# Patient Record
Sex: Female | Born: 1950 | Race: White | Hispanic: No | Marital: Married | Smoking: Never smoker
Health system: Southern US, Community
[De-identification: ages and names within clinical notes are randomized; demographics above are authoritative.]

## PROBLEM LIST (undated history)

## (undated) DIAGNOSIS — M1990 Unspecified osteoarthritis, unspecified site: Secondary | ICD-10-CM

## (undated) DIAGNOSIS — E878 Other disorders of electrolyte and fluid balance, not elsewhere classified: Secondary | ICD-10-CM

## (undated) DIAGNOSIS — Z9889 Other specified postprocedural states: Secondary | ICD-10-CM

## (undated) DIAGNOSIS — I10 Essential (primary) hypertension: Secondary | ICD-10-CM

## (undated) DIAGNOSIS — K219 Gastro-esophageal reflux disease without esophagitis: Secondary | ICD-10-CM

## (undated) DIAGNOSIS — D649 Anemia, unspecified: Secondary | ICD-10-CM

## (undated) DIAGNOSIS — R112 Nausea with vomiting, unspecified: Secondary | ICD-10-CM

## (undated) HISTORY — PX: CATARACT EXTRACTION: SUR2

## (undated) HISTORY — PX: CHOLECYSTECTOMY: SHX55

---

## 1977-02-10 DIAGNOSIS — R011 Cardiac murmur, unspecified: Secondary | ICD-10-CM

## 1977-02-10 HISTORY — DX: Cardiac murmur, unspecified: R01.1

## 1977-02-23 DIAGNOSIS — R011 Cardiac murmur, unspecified: Secondary | ICD-10-CM | POA: Insufficient documentation

## 2002-02-10 HISTORY — PX: CHOLECYSTECTOMY: SHX55

## 2002-02-10 HISTORY — PX: BREAST BIOPSY: SHX20

## 2002-07-25 DIAGNOSIS — E7800 Pure hypercholesterolemia, unspecified: Secondary | ICD-10-CM | POA: Insufficient documentation

## 2010-12-30 ENCOUNTER — Ambulatory Visit: Payer: Self-pay

## 2011-10-21 ENCOUNTER — Ambulatory Visit: Payer: Self-pay | Admitting: Family Medicine

## 2013-03-15 DIAGNOSIS — I10 Essential (primary) hypertension: Secondary | ICD-10-CM | POA: Insufficient documentation

## 2013-03-23 ENCOUNTER — Ambulatory Visit: Payer: Self-pay | Admitting: Internal Medicine

## 2014-10-19 ENCOUNTER — Other Ambulatory Visit: Payer: Self-pay | Admitting: Family

## 2014-10-19 DIAGNOSIS — Z1231 Encounter for screening mammogram for malignant neoplasm of breast: Secondary | ICD-10-CM

## 2014-10-25 ENCOUNTER — Ambulatory Visit: Payer: Self-pay

## 2015-04-08 ENCOUNTER — Ambulatory Visit
Admission: EM | Admit: 2015-04-08 | Discharge: 2015-04-08 | Disposition: A | Discharge status: Home / Self Care | Payer: Medicare PPO | Attending: Family Medicine | Admitting: Family Medicine

## 2015-04-08 DIAGNOSIS — B349 Viral infection, unspecified: Secondary | ICD-10-CM

## 2015-04-08 HISTORY — DX: Essential (primary) hypertension: I10

## 2015-04-08 HISTORY — DX: Other disorders of electrolyte and fluid balance, not elsewhere classified: E87.8

## 2015-04-08 LAB — RAPID INFLUENZA A&B ANTIGENS (ARMC ONLY)
INFLUENZA A (ARMC): NOT DETECTED
INFLUENZA B (ARMC): NOT DETECTED

## 2015-04-08 MED ORDER — HYDROCOD POLST-CPM POLST ER 10-8 MG/5ML PO SUER: 5.0000 mL | Freq: Two times a day (BID) | ORAL | Status: DC | PRN

## 2015-04-08 MED ORDER — OSELTAMIVIR PHOSPHATE 75 MG PO CAPS: 75.0000 mg | ORAL_CAPSULE | Freq: Two times a day (BID) | ORAL | Status: DC

## 2015-04-08 NOTE — ED Notes (Signed)
Patient states that her husband has the flu.  She is experiencing cough, fever, body aches, and slight nasal congestion which all started last night.

## 2015-04-08 NOTE — ED Provider Notes (Signed)
CSN: [id number]     Arrival date & time 04/08/15  1404 History   None    Chief Complaint  Patient presents with  . Cough  . Generalized Body Aches   (Consider location/radiation/quality/duration/timing/severity/associated sxs/prior Treatment) Patient is a 65 y.o. female presenting with URI. The history is provided by the patient.  URI Presenting symptoms: congestion, cough, fatigue, fever and rhinorrhea   Severity:  Moderate Onset quality:  Sudden Timing:  Constant Progression:  Worsening Chronicity:  New Relieved by:  None tried Ineffective treatments:  None tried Associated symptoms: arthralgias and myalgias   Associated symptoms: no neck pain, no sinus pain, no sneezing, no swollen glands and no wheezing   Risk factors: sick contacts (husband with flu (test positive))   Risk factors: not elderly, no chronic cardiac disease, no chronic kidney disease, no chronic respiratory disease, no diabetes mellitus, no immunosuppression and no recent illness     Past Medical History  Diagnosis Date  . Hypertension   . Hypochloremia    Past Surgical History  Procedure Laterality Date  . Cholecystectomy     Family History  Problem Relation Age of Onset  . Heart disease Mother   . Heart disease Father   . Heart disease Sister    Social History  Substance Use Topics  . Smoking status: Never Smoker   . Smokeless tobacco: Never Used  . Alcohol Use: Yes     Comment: Occ.   OB History    No data available     Review of Systems  Constitutional: Positive for fever and fatigue.  HENT: Positive for congestion and rhinorrhea. Negative for sneezing.   Respiratory: Positive for cough. Negative for wheezing.   Musculoskeletal: Positive for myalgias and arthralgias. Negative for neck pain.    Allergies  Review of patient's allergies indicates no known allergies.  Home Medications   Prior to Admission medications   Medication Sig Start Date End Date Taking? Authorizing Provider   amLODipine (NORVASC) 5 MG tablet Take 5 mg by mouth daily.   Yes Historical Provider, MD  cetirizine (ZYRTEC) 10 MG tablet Take 10 mg by mouth daily.   Yes Historical Provider, MD  hydrochlorothiazide (HYDRODIURIL) 25 MG tablet Take 25 mg by mouth daily.   Yes Historical Provider, MD  meloxicam (MOBIC) 15 MG tablet Take 15 mg by mouth daily.   Yes Historical Provider, MD  pravastatin (PRAVACHOL) 80 MG tablet Take 80 mg by mouth daily.   Yes Historical Provider, MD  Probiotic Product (PROBIOTIC DAILY PO) Take by mouth.   Yes Historical Provider, MD  ramipril (ALTACE) 10 MG capsule Take 10 mg by mouth daily.   Yes Historical Provider, MD  Triamcinolone Acetonide (NASACORT ALLERGY 24HR NA) Place into the nose.   Yes Historical Provider, MD  chlorpheniramine-HYDROcodone (TUSSIONEX PENNKINETIC ER) 10-8 MG/5ML SUER Take 5 mLs by mouth every 12 (twelve) hours as needed for cough. 04/08/15   Payton Mccallum, MD  oseltamivir (TAMIFLU) 75 MG capsule Take 1 capsule (75 mg total) by mouth 2 (two) times daily. 04/08/15   Payton Mccallum, MD   Meds Ordered and Administered this Visit  Medications - No data to display  BP 165/76 mmHg  Pulse 120  Temp(Src) 99.1 F (37.3 C) (Oral)  Resp 20  Ht  (1.702 m)  Wt 220 lb (99.791 kg)  BMI 34.45 kg/m2  SpO2 98% No data found.   Physical Exam  Constitutional: She appears well-developed and well-nourished. No distress.  HENT:  Head: Normocephalic.  Right Ear: Tympanic membrane, external ear and ear canal normal.  Left Ear: Tympanic membrane, external ear and ear canal normal.  Nose: Rhinorrhea present.  Mouth/Throat: Oropharynx is clear and moist and mucous membranes are normal. No oropharyngeal exudate, posterior oropharyngeal edema, posterior oropharyngeal erythema or tonsillar abscesses.  Eyes: Conjunctivae and EOM are normal. Pupils are equal, round, and reactive to light. Right eye exhibits no discharge. Left eye exhibits no discharge. No scleral  icterus.  Neck: Normal range of motion. Neck supple. No JVD present. No tracheal deviation present. No thyromegaly present.  Cardiovascular: Normal rate, regular rhythm, normal heart sounds and intact distal pulses.   No murmur heard. Pulmonary/Chest: Effort normal and breath sounds normal. No stridor. No respiratory distress. She has no wheezes. She has no rales. She exhibits no tenderness.  Lymphadenopathy:    She has no cervical adenopathy.  Neurological: She is alert.  Skin: She is not diaphoretic.  Nursing note and vitals reviewed.   ED Course  Procedures (including critical care time)  Labs Review Labs Reviewed  RAPID INFLUENZA A&B ANTIGENS [facility] ONLY)    Imaging Review No results found.   Visual Acuity Review  Right Eye Distance:   Left Eye Distance:   Bilateral Distance:    Right Eye Near:   Left Eye Near:    Bilateral Near:         MDM   1. Viral syndrome    Discharge Medication List as of 04/08/2015  4:57 PM    START taking these medications   Details  chlorpheniramine-HYDROcodone (TUSSIONEX PENNKINETIC ER) 10-8 MG/5ML SUER Take 5 mLs by mouth every 12 (twelve) hours as needed for cough., Starting 04/08/2015, Until Discontinued, Normal    oseltamivir (TAMIFLU) 75 MG capsule Take 1 capsule (75 mg total) by mouth 2 (two) times daily., Starting 04/08/2015, Until Discontinued, Print       1. diagnosis reviewed with patient 2. rx as per orders above; reviewed possible side effects, interactions, risks and benefits  3. Recommend supportive treatment with fluids, otc analgesics 4. Follow-up prn if symptoms worsen or don't improve    Payton Mccallum, MD 04/08/15 1840

## 2015-11-23 ENCOUNTER — Other Ambulatory Visit: Payer: Self-pay | Admitting: Specialist

## 2015-11-23 DIAGNOSIS — Z1231 Encounter for screening mammogram for malignant neoplasm of breast: Secondary | ICD-10-CM

## 2015-11-28 ENCOUNTER — Ambulatory Visit
Admission: RE | Admit: 2015-11-28 | Discharge: 2015-11-28 | Disposition: A | Discharge status: Home / Self Care | Payer: Medicare PPO | Source: Ambulatory Visit | Attending: Specialist | Admitting: Specialist

## 2015-11-28 DIAGNOSIS — R928 Other abnormal and inconclusive findings on diagnostic imaging of breast: Secondary | ICD-10-CM | POA: Insufficient documentation

## 2015-11-28 DIAGNOSIS — Z1231 Encounter for screening mammogram for malignant neoplasm of breast: Secondary | ICD-10-CM | POA: Diagnosis not present

## 2015-12-03 ENCOUNTER — Other Ambulatory Visit: Payer: Self-pay | Admitting: Specialist

## 2015-12-03 DIAGNOSIS — R928 Other abnormal and inconclusive findings on diagnostic imaging of breast: Secondary | ICD-10-CM

## 2016-11-15 ENCOUNTER — Ambulatory Visit
Admission: EM | Admit: 2016-11-15 | Discharge: 2016-11-15 | Disposition: A | Discharge status: Home / Self Care | Payer: Medicare PPO | Attending: Family Medicine | Admitting: Family Medicine

## 2016-11-15 DIAGNOSIS — M79644 Pain in right finger(s): Secondary | ICD-10-CM | POA: Diagnosis not present

## 2016-11-15 DIAGNOSIS — S60452A Superficial foreign body of right middle finger, initial encounter: Secondary | ICD-10-CM

## 2016-11-15 DIAGNOSIS — S60459A Superficial foreign body of unspecified finger, initial encounter: Secondary | ICD-10-CM

## 2016-11-15 NOTE — ED Provider Notes (Signed)
[facility]    CSN: [id number] Arrival date & time: 11/15/16  1550     History   Chief Complaint Chief Complaint  Patient presents with  . Foreign Body in Skin    HPI Tracey Rosales is a 66 y.o. female.   66 yo female with a c/o splinter under her right hand middle finger nail. Incident happened about 1-2 hours ago while putting a tray in a cabinet. Patient is up to date on her tetanus vaccine and otherwise healthy.    The history is provided by the patient.    Past Medical History:  Diagnosis Date  . Hypertension   . Hypochloremia     There are no active problems to display for this patient.   Past Surgical History:  Procedure Laterality Date  . BREAST BIOPSY Right 2004   neg  . CHOLECYSTECTOMY      OB History    No data available       Home Medications    Prior to Admission medications   Medication Sig Start Date End Date Taking? Authorizing Provider  azelastine (ASTELIN) 0.1 % nasal spray Place into both nostrils 2 (two) times daily. Use in each nostril as directed   Yes [provider]  amLODipine (NORVASC) 5 MG tablet Take 5 mg by mouth daily.    [provider]  cetirizine (ZYRTEC) 10 MG tablet Take 10 mg by mouth daily.    [provider]  chlorpheniramine-HYDROcodone (TUSSIONEX PENNKINETIC ER) 10-8 MG/5ML SUER Take 5 mLs by mouth every 12 (twelve) hours as needed for cough. 04/08/15   Payton Mccallum, MD  hydrochlorothiazide (HYDRODIURIL) 25 MG tablet Take 25 mg by mouth daily.    [provider]  meloxicam (MOBIC) 15 MG tablet Take 15 mg by mouth daily.    [provider]  oseltamivir (TAMIFLU) 75 MG capsule Take 1 capsule (75 mg total) by mouth 2 (two) times daily. 04/08/15   Payton Mccallum, MD  pravastatin (PRAVACHOL) 80 MG tablet Take 80 mg by mouth daily.    [provider]  Probiotic Product (PROBIOTIC DAILY PO) Take by mouth.    [provider]  ramipril (ALTACE) 10  MG capsule Take 10 mg by mouth daily.    [provider]  Triamcinolone Acetonide (NASACORT ALLERGY 24HR NA) Place into the nose.    [provider]    Family History Family History  Problem Relation Age of Onset  . Heart disease Mother   . Heart disease Father   . Heart disease Sister   . Breast cancer Neg Hx     Social History Social History  Substance Use Topics  . Smoking status: Never Smoker  . Smokeless tobacco: Never Used  . Alcohol use Yes     Comment: Occ.     Allergies   Sulfa antibiotics   Review of Systems Review of Systems   Physical Exam Triage Vital Signs ED Triage Vitals  Enc Vitals Group     BP 11/15/16 1602 127/76     Pulse Rate 11/15/16 1602 76     Resp 11/15/16 1602 18     Temp 11/15/16 1602 97.9 F (36.6 C)     Temp Source 11/15/16 1602 Oral     SpO2 11/15/16 1602 100 %     Weight 11/15/16 1600 210 lb (95.3 kg)     Height 11/15/16 1600  (1.702 m)     Head Circumference --      Peak  Flow --      Pain Score 11/15/16 1600 3     Pain Loc --      Pain Edu? --      Excl. in GC? --    No data found.   Updated Vital Signs BP 127/76 (BP Location: Left Arm)   Pulse 76   Temp 97.9 F (36.6 C) (Oral)   Resp 18   Ht  (1.702 m)   Wt 210 lb (95.3 kg)   SpO2 100%   BMI 32.89 kg/m   Visual Acuity Right Eye Distance:   Left Eye Distance:   Bilateral Distance:    Right Eye Near:   Left Eye Near:    Bilateral Near:     Physical Exam   UC Treatments / Results  Labs (all labs ordered are listed, but only abnormal results are displayed) Labs Reviewed - No data to display  EKG  EKG Interpretation None       Radiology No results found.  Procedures .Foreign Body Removal Date/Time: 11/15/2016 4:33 PM Performed by: Payton Mccallum Authorized by: Payton Mccallum  Consent: Verbal consent obtained. Risks and benefits: risks, benefits and alternatives were discussed Consent given by: patient Patient  understanding: patient states understanding of the procedure being performed Patient consent: the patient's understanding of the procedure matches consent given Patient identity confirmed: verbally with patient Body area: skin General location: upper extremity Location details: right long finger Anesthesia: local infiltration  Anesthesia: Local Anesthetic: lidocaine 1% without epinephrine Anesthetic total: 2 mL  Sedation: Patient sedated: no Patient restrained: no Patient cooperative: yes Localization method: visualized Removal mechanism: forceps Dressing: antibiotic ointment and dressing applied Tendon involvement: none Complexity: simple 1 objects recovered. Objects recovered: wood splinter Post-procedure assessment: foreign body removed Patient tolerance: Patient tolerated the procedure well with no immediate complications   (including critical care time)  Medications Ordered in UC Medications - No data to display   Initial Impression / Assessment and Plan / UC Course  I have reviewed the triage vital signs and the nursing notes.  Pertinent labs & imaging results that were available during my care of the patient were reviewed by me and considered in my medical decision making (see chart for details).       Final Clinical Impressions(s) / UC Diagnoses   Final diagnoses:  Finger, superficial foreign body (splinter), initial encounter    New Prescriptions Discharge Medication List as of 11/15/2016  4:30 PM     1. diagnosis reviewed with patient 2. Foreign body (splinter) removed completely as per procedure note above 3. Recommend supportive treatment with routine wound care 4. Follow-up prn if symptoms worsen or don't improve   Controlled Substance Prescriptions [place] Controlled Substance Registry consulted? Not Applicable   Payton Mccallum, MD 11/15/16 [phone]

## 2016-11-15 NOTE — ED Triage Notes (Signed)
Pt was putting a tray in a cabinet and got a splinter stuck under her right hand middle finger nail.

## 2017-05-07 DIAGNOSIS — M1712 Unilateral primary osteoarthritis, left knee: Secondary | ICD-10-CM | POA: Insufficient documentation

## 2019-03-24 ENCOUNTER — Ambulatory Visit: Payer: BC Managed Care – PPO

## 2019-08-09 NOTE — Progress Notes (Signed)
DUE TO COVID-19 ONLY ONE VISITOR IS ALLOWED TO COME WITH YOU AND STAY IN THE WAITING ROOM ONLY DURING PRE OP AND PROCEDURE DAY OF SURGERY. THE 1 VISITOR MAY VISIT WITH YOU AFTER SURGERY IN YOUR PRIVATE ROOM DURING VISITING HOURS ONLY!  YOU NEED TO HAVE A COVID 19 TEST ON_7/2/21 ______ @_______ , THIS TEST MUST BE DONE BEFORE SURGERY, COME  [address], [place] , .  [facility]) ONCE YOUR COVID TEST IS COMPLETED, PLEASE BEGIN THE QUARANTINE INSTRUCTIONS AS OUTLINED IN YOUR HANDOUT.                Tracey Rosales  08/09/2019   Your procedure is scheduled on:          08/18/19  Report to [facility] Main  Entrance   Report to admitting at    0610am      Call this number if you have problems the morning of surgery [phone]    Remember: Do not eat food    :After Midnight. BRUSH YOUR TEETH MORNING OF SURGERY AND RINSE YOUR MOUTH OUT, NO CHEWING GUM CANDY OR MINTS.     Take these medicines the morning of surgery with A SIP OF WATER:  Amlodipine, nasal spray, zyrtec, Pravastatin                                 You may not have any metal on your body including hair pins and              piercings  Do not wear jewelry, make-up, lotions, powders or perfumes, deodorant             Do not wear nail polish on your fingernails.  Do not shave  48 hours prior to surgery.                 Do not bring valuables to the hospital.  IS NOT             RESPONSIBLE   FOR VALUABLES.  Contacts, dentures or bridgework may not be worn into surgery.  Leave suitcase in the car. After surgery it may be brought to your room.     Patients discharged the day of surgery will not be allowed to drive home. IF YOU ARE HAVING SURGERY AND GOING HOME THE SAME DAY, YOU MUST HAVE AN ADULT TO DRIVE YOU HOME AND BE WITH YOU FOR 24 HOURS. YOU MAY GO HOME BY TAXI OR UBER OR ORTHERWISE, BUT AN ADULT MUST ACCOMPANY YOU HOME AND STAY WITH YOU FOR 24 HOURS.  Name and phone  number of your driver:                Please read over the following fact sheets you were given: _____________________________________________________________________             NO SOLID FOOD AFTER MIDNIGHT THE NIGHT PRIOR TO SURGERY. NOTHING BY MOUTH EXCEPT CLEAR LIQUIDS UNTIL  0540am . PLEASE FINISH ENSURE DRINK PER SURGEON ORDER  WHICH NEEDS TO BE COMPLETED AT 0540am.   CLEAR LIQUID DIET   Foods Allowed  Foods Excluded  Coffee and tea, regular and decaf                             liquids that you cannot  Plain Jell-O any favor except red or purple                                           see through such as: Fruit ices (not with fruit pulp)                                     milk, soups, orange juice  Iced Popsicles                                    All solid food Carbonated beverages, regular and diet                                    Cranberry, grape and apple juices Sports drinks like Gatorade Lightly seasoned clear broth or consume(fat free) Sugar, honey syrup  _____________________________________________________________________  [facility] - Preparing for Surgery Before surgery, you can play an important role.  Because skin is not sterile, your skin needs to be as free of germs as possible.  You can reduce the number of germs on your skin by washing with CHG (chlorahexidine gluconate) soap before surgery.  CHG is an antiseptic cleaner which kills germs and bonds with the skin to continue killing germs even after washing. Please DO NOT use if you have an allergy to CHG or antibacterial soaps.  If your skin becomes reddened/irritated stop using the CHG and inform your nurse when you arrive at Short Stay. Do not shave (including legs and underarms) for at least 48 hours prior to the first CHG shower.  You may shave your face/neck. Please follow these instructions carefully:  1.  Shower with CHG Soap the  night before surgery and the  morning of Surgery.  2.  If you choose to wash your hair, wash your hair first as usual with your  normal  shampoo.  3.  After you shampoo, rinse your hair and body thoroughly to remove the  shampoo.                           4.  Use CHG as you would any other liquid soap.  You can apply chg directly  to the skin and wash                       Gently with a scrungie or clean washcloth.  5.  Apply the CHG Soap to your body ONLY FROM THE NECK DOWN.   Do not use on face/ open                           Wound or open sores. Avoid contact with eyes, ears mouth and genitals (private parts).  Wash face,  Genitals (private parts) with your normal soap.             6.  Wash thoroughly, paying special attention to the area where your surgery  will be performed.  7.  Thoroughly rinse your body with warm water from the neck down.  8.  DO NOT shower/wash with your normal soap after using and rinsing off  the CHG Soap.                9.  Pat yourself dry with a clean towel.            10.  Wear clean pajamas.            11.  Place clean sheets on your bed the night of your first shower and do not  sleep with pets. Day of Surgery : Do not apply any lotions/deodorants the morning of surgery.  Please wear clean clothes to the hospital/surgery center.  FAILURE TO FOLLOW THESE INSTRUCTIONS MAY RESULT IN THE CANCELLATION OF YOUR SURGERY PATIENT SIGNATURE_________________________________  NURSE SIGNATURE__________________________________  ________________________________________________________________________   Tracey Rosales  An incentive spirometer is a tool that can help keep your lungs clear and active. This tool measures how well you are filling your lungs with each breath. Taking long deep breaths may help reverse or decrease the chance of developing breathing (pulmonary) problems (especially infection) following:  A long period of time when you  are unable to move or be active. BEFORE THE PROCEDURE   If the spirometer includes an indicator to show your best effort, your nurse or respiratory therapist will set it to a desired goal.  If possible, sit up straight or lean slightly forward. Try not to slouch.  Hold the incentive spirometer in an upright position. INSTRUCTIONS FOR USE  1. Sit on the edge of your bed if possible, or sit up as far as you can in bed or on a chair. 2. Hold the incentive spirometer in an upright position. 3. Breathe out normally. 4. Place the mouthpiece in your mouth and seal your lips tightly around it. 5. Breathe in slowly and as deeply as possible, raising the piston or the ball toward the top of the column. 6. Hold your breath for 3-5 seconds or for as long as possible. Allow the piston or ball to fall to the bottom of the column. 7. Remove the mouthpiece from your mouth and breathe out normally. 8. Rest for a few seconds and repeat Steps 1 through 7 at least 10 times every 1-2 hours when you are awake. Take your time and take a few normal breaths between deep breaths. 9. The spirometer may include an indicator to show your best effort. Use the indicator as a goal to work toward during each repetition. 10. After each set of 10 deep breaths, practice coughing to be sure your lungs are clear. If you have an incision (the cut made at the time of surgery), support your incision when coughing by placing a pillow or rolled up towels firmly against it. Once you are able to get out of bed, walk around indoors and cough well. You may stop using the incentive spirometer when instructed by your caregiver.  RISKS AND COMPLICATIONS  Take your time so you do not get dizzy or light-headed.  If you are in pain, you may need to take or ask for pain medication before doing incentive spirometry. It is harder to take a deep breath if you are having pain.  AFTER USE  Rest and breathe slowly and easily.  It can be helpful to  keep track of a log of your progress. Your caregiver can provide you with a simple table to help with this. If you are using the spirometer at home, follow these instructions: [place] IF:   You are having difficultly using the spirometer.  You have trouble using the spirometer as often as instructed.  Your pain medication is not giving enough relief while using the spirometer.  You develop fever of 100.5 F (38.1 C) or higher. SEEK IMMEDIATE MEDICAL CARE IF:   You cough up bloody sputum that had not been present before.  You develop fever of 102 F (38.9 C) or greater.  You develop worsening pain at or near the incision site. MAKE SURE YOU:   Understand these instructions.  Will watch your condition.  Will get help right away if you are not doing well or get worse. Document Released: 06/09/2006 Document Revised: 04/21/2011 Document Reviewed: 08/10/2006 [facility] Patient Information 2014 [place], [place].   ________________________________________________________________________

## 2019-08-09 NOTE — H&P (Addendum)
TOTAL KNEE ADMISSION H&P  Patient is being admitted for right total knee arthroplasty.  Subjective:  Chief Complaint: Right knee primary OA / pain  HPI: Tracey Rosales, 69 y.o. female, has a history of pain and functional disability in the right knee due to arthritis and has failed non-surgical conservative treatments for greater than 12 weeks to include  NSAID's and/or analgesics, corticosteriod injections, viscosupplementation injections and activity modification.  Onset of symptoms was gradual, starting 4 years ago with gradually worsening course since that time. The patient noted no past surgery on the right knee(s).  Patient currently rates pain in the right knee(s) at 8 out of 10 with activity. Patient has night pain, worsening of pain with activity and weight bearing, pain that interferes with activities of daily living, pain with passive range of motion, crepitus and joint swelling.  Patient has evidence of periarticular osteophytes and joint space narrowing by imaging studies.  There is no active infection.  Risks, benefits and expectations were discussed with the patient.  Risks including but not limited to the risk of anesthesia, blood clots, nerve damage, blood vessel damage, failure of the prosthesis, infection and up to and including death.  Patient understand the risks, benefits and expectations and wishes to proceed with surgery.   PCP: Aldean Ast, MD  D/C Plans:       Home   Post-op Meds:       No Rx given   Tranexamic Acid:      To be given - IV   Decadron:      Is to be given  FYI:     ASA  Tramadol & APAP  DME:    Rx for equipment is sent  PT:   OPPT - EO in [place]  Pharmacy: [facility] [place]     Past Medical History:  Diagnosis Date  . Arthritis    Hands, knees,  . GERD (gastroesophageal reflux disease)   . Heart murmur 1979  . Hypertension   . Hypochloremia   . PONV (postoperative nausea and vomiting)     Past Surgical History:  Procedure  Laterality Date  . BREAST BIOPSY Right 2004   neg  . CHOLECYSTECTOMY  2004    No current facility-administered medications for this encounter.   Current Outpatient Medications  Medication Sig Dispense Refill Last Dose  . acetaminophen (TYLENOL) 650 MG CR tablet Take 1,300 mg by mouth every 8 (eight) hours as needed for pain.     Marland Kitchen amLODipine (NORVASC) 5 MG tablet Take 5 mg by mouth daily.     Marland Kitchen azelastine (ASTELIN) 0.1 % nasal spray Place into both nostrils 2 (two) times daily. Use in each nostril as directed     . cetirizine (ZYRTEC) 10 MG tablet Take 10 mg by mouth daily.     . hydrochlorothiazide (HYDRODIURIL) 25 MG tablet Take 25 mg by mouth daily.     . Iron-Vitamins (GERITOL COMPLETE) TABS Take 1 tablet by mouth daily. (Patient not taking: Reported on 08/25/2019)     . pravastatin (PRAVACHOL) 80 MG tablet Take 80 mg by mouth daily.     . Probiotic Product (PROBIOTIC DAILY PO) Take by mouth.     . ramipril (ALTACE) 5 MG capsule Take 5 mg by mouth 2 (two) times daily.      . Triamcinolone Acetonide (NASACORT ALLERGY 24HR NA) Place 2 sprays into the nose in the morning and at bedtime.      Marland Kitchen aspirin 81 MG chewable tablet Baby  Aspirin     . famotidine (PEPCID) 10 MG tablet       Allergies  Allergen Reactions  . Atorvastatin Other (See Comments)    Muscle pain   . Sulfa Antibiotics Hives    Social History   Tobacco Use  . Smoking status: Never Smoker  . Smokeless tobacco: Never Used  Substance Use Topics  . Alcohol use: Yes    Comment: Occ.    Family History  Problem Relation Age of Onset  . Heart disease Mother   . Heart disease Father   . Heart disease Sister   . Breast cancer Neg Hx      Review of Systems  Constitutional: Negative.   HENT: Negative.   Eyes: Negative.   Respiratory: Negative.   Cardiovascular: Negative.   Gastrointestinal: Positive for heartburn.  Genitourinary: Negative.   Musculoskeletal: Positive for joint pain.  Skin: Negative.    Neurological: Negative.   Endo/Heme/Allergies: Positive for environmental allergies.  Psychiatric/Behavioral: Negative.       Objective:  Physical Exam Constitutional:      Appearance: She is well-developed.  HENT:     Head: Normocephalic.  Eyes:     Pupils: Pupils are equal, round, and reactive to light.  Neck:     Thyroid: No thyromegaly.     Vascular: No JVD.     Trachea: No tracheal deviation.  Cardiovascular:     Rate and Rhythm: Normal rate and regular rhythm.     Heart sounds: Murmur heard.   Pulmonary:     Effort: Pulmonary effort is normal. No respiratory distress.     Breath sounds: Normal breath sounds. No wheezing.  Abdominal:     Palpations: Abdomen is soft.     Tenderness: There is no abdominal tenderness. There is no guarding.  Musculoskeletal:     Cervical back: Neck supple.     Right knee: Swelling and bony tenderness present. No erythema or ecchymosis. Decreased range of motion. Tenderness present.  Lymphadenopathy:     Cervical: No cervical adenopathy.  Skin:    General: Skin is warm and dry.  Neurological:     Mental Status: She is alert and oriented to person, place, and time.      Labs:  Estimated body mass index is 32.2 kg/m as calculated from the following:   Height as of 09/06/19: 5\' 6"  (1.676 m).   Weight as of 09/06/19: 90.5 kg.   Imaging Review Plain radiographs demonstrate severe degenerative joint disease of the right knee(s). The bone quality appears to be good for age and reported activity level.      Assessment/Plan:  End stage arthritis, right knee   The patient history, physical examination, clinical judgment of the provider and imaging studies are consistent with end stage degenerative joint disease of the right knee(s) and total knee arthroplasty is deemed medically necessary. The treatment options including medical management, injection therapy arthroscopy and arthroplasty were discussed at length. The risks and  benefits of total knee arthroplasty were presented and reviewed. The risks due to aseptic loosening, infection, stiffness, patella tracking problems, thromboembolic complications and other imponderables were discussed. The patient acknowledged the explanation, agreed to proceed with the plan and consent was signed. Patient is being admitted for inpatient treatment for surgery, pain control, PT, OT, prophylactic antibiotics, VTE prophylaxis, progressive ambulation and ADL's and discharge planning. The patient is planning to be discharged home    Patient's anticipated LOS is less than 2 midnights, meeting these requirements: - Lives within  1 hour of care - Has a competent adult at home to recover with post-op recover - NO history of  - Chronic pain requiring opiods  - Diabetes  - Coronary Artery Disease  - Heart failure  - Heart attack  - Stroke  - DVT/VTE  - Cardiac arrhythmia  - Respiratory Failure/COPD  - Renal failure  - Anemia  - Advanced Liver disease    Anastasio Auerbach. [place] Depaz   PA-C  09/12/2019, 10:05 PM

## 2019-08-10 ENCOUNTER — Encounter (HOSPITAL_COMMUNITY)
Admission: RE | Admit: 2019-08-10 | Discharge: 2019-08-10 | Disposition: A | Discharge status: Home / Self Care | Payer: BC Managed Care – PPO | Source: Ambulatory Visit | Attending: Orthopedic Surgery | Admitting: Orthopedic Surgery

## 2019-08-19 ENCOUNTER — Telehealth: Payer: Self-pay

## 2019-08-19 NOTE — Telephone Encounter (Signed)
   [facility] Pre-operative Risk Assessment    HEARTCARE STAFF: - Please ensure there is not already an duplicate clearance open for this procedure. - Under Visit Info/Reason for Call, type in Other and utilize the format Clearance MM/DD/YY or Clearance TBD. Do not use dashes or single digits. - If request is for dental extraction, please clarify the # of teeth to be extracted.  Request for surgical clearance:  1. What type of surgery is being performed? Right Total Knee Arthoplasty  2. When is this surgery scheduled? 09/13/2019   3. What type of clearance is required (medical clearance vs. Pharmacy clearance to hold med vs. Both)? Medical  4. Are there any medications that need to be held prior to surgery and how long?  5. Practice name and name of physician performing surgery? [facility], Dr. Paralee Cancel  6. What is the office phone number? [phone]   7.   What is the office fax number? [phone]  8.   Anesthesia type (None, local, MAC, general) ? Spinal   Tracey Rosales 08/19/2019, 3:18 PM  _________________________________________________________________   (provider comments below)

## 2019-08-22 NOTE — Telephone Encounter (Signed)
Forwarded to requesting providers office via [facility] fax function Dr Bjorn Pippin appt 7-15

## 2019-08-22 NOTE — Telephone Encounter (Signed)
   Primary Cardiologist: New to [facility]  Chart reviewed as part of pre-operative protocol coverage. Patient has an upcoming appointment with Dr. Bjorn Pippin 08/25/19 for preop assessment (already noted in appointment comments).   Pre-op covering staff: - Please contact requesting surgeon's office via preferred method (i.e, phone, fax) to inform them of need for appointment prior to surgery.  Beatriz Stallion, PA-C  08/22/2019, 1:41 PM

## 2019-08-25 ENCOUNTER — Ambulatory Visit (INDEPENDENT_AMBULATORY_CARE_PROVIDER_SITE_OTHER): Payer: Medicare PPO | Admitting: Cardiology

## 2019-08-25 ENCOUNTER — Other Ambulatory Visit: Payer: Self-pay

## 2019-08-25 ENCOUNTER — Encounter: Payer: Self-pay | Admitting: Cardiology

## 2019-08-25 VITALS — BP 142/80 | HR 86 | Temp 96.9°F | Ht 66.5 in | Wt 199.6 lb

## 2019-08-25 DIAGNOSIS — R9431 Abnormal electrocardiogram [ECG] [EKG]: Secondary | ICD-10-CM

## 2019-08-25 DIAGNOSIS — R011 Cardiac murmur, unspecified: Secondary | ICD-10-CM | POA: Diagnosis not present

## 2019-08-25 DIAGNOSIS — Z01818 Encounter for other preprocedural examination: Secondary | ICD-10-CM

## 2019-08-25 DIAGNOSIS — I10 Essential (primary) hypertension: Secondary | ICD-10-CM

## 2019-08-25 DIAGNOSIS — Z01810 Encounter for preprocedural cardiovascular examination: Secondary | ICD-10-CM

## 2019-08-25 DIAGNOSIS — E785 Hyperlipidemia, unspecified: Secondary | ICD-10-CM

## 2019-08-25 LAB — BASIC METABOLIC PANEL
BUN/Creatinine Ratio: 27 (ref 12–28)
BUN: 18 mg/dL (ref 8–27)
CO2: 23 mmol/L (ref 20–29)
Calcium: 9.9 mg/dL (ref 8.7–10.3)
Chloride: 100 mmol/L (ref 96–106)
Creatinine, Ser: 0.66 mg/dL (ref 0.57–1.00)
GFR calc Af Amer: 104 mL/min/{1.73_m2} (ref >59)
GFR calc non Af Amer: 90 mL/min/{1.73_m2} (ref >59)
Glucose: 123 mg/dL — ABNORMAL HIGH (ref 65–99)
Potassium: 4.3 mmol/L (ref 3.5–5.2)
Sodium: 140 mmol/L (ref 134–144)

## 2019-08-25 LAB — TSH: TSH: 1.52 u[IU]/mL (ref 0.450–4.500)

## 2019-08-25 LAB — MAGNESIUM: Magnesium: 2.2 mg/dL (ref 1.6–2.3)

## 2019-08-25 NOTE — Patient Instructions (Addendum)
Medication Instructions:  Your physician recommends that you continue on your current medications as directed. Please refer to the Current Medication list given to you today.  Lab Work: Nutritional therapist, Mag, TSH today  If you have labs (blood work) drawn today and your tests are completely normal, you will receive your results only by: Marland Kitchen MyChart Message (if you have MyChart) OR . A paper copy in the mail If you have any lab test that is abnormal or we need to change your treatment, we will call you to review the results.   Testing/Procedures: Your physician has requested that you have an echocardiogram ASAP (surgery scheduled 8/3). Echocardiography is a painless test that uses sound waves to create images of your heart. It provides your doctor with information about the size and shape of your heart and how well your heart's chambers and valves are working. This procedure takes approximately one hour. There are no restrictions for this procedure.   Follow-Up: At [facility], you and your health needs are our priority.  As part of our continuing mission to provide you with exceptional heart care, we have created designated Provider Care Teams.  These Care Teams include your primary Cardiologist (physician) and Advanced Practice Providers (APPs -  Physician Assistants and Nurse Practitioners) who all work together to provide you with the care you need, when you need it.  We recommend signing up for the patient portal called "MyChart".  Sign up information is provided on this After Visit Summary.  MyChart is used to connect with patients for Virtual Visits (Telemedicine).  Patients are able to view lab/test results, encounter notes, upcoming appointments, etc.  Non-urgent messages can be sent to your provider as well.   To learn more about what you can do with MyChart, go to [email or web address].    Your next appointment:   6 month(s)  The format for your next appointment:   In Person  Provider:    Epifanio Lesches, MD

## 2019-08-25 NOTE — Progress Notes (Signed)
Cardiology Office Note:    Date:  08/25/2019   ID:  Georges Lynch Viramontes, DOB 03-07-1950, MRN [id number]  PCP:  System, Provider Not In  Cardiologist:  No primary care provider on file.  Electrophysiologist:  None   Referring MD: Jenell Milliner, MD   Chief Complaint  Patient presents with  . New Patient (Initial Visit)    Pre op clearance, Abnormal EKG  . Pre-op Exam    History of Present Illness:    Tracey Rosales is a 69 y.o. female with a hx of hypertension, hyperlipidemia is referred for preop evaluation by Dr. Vinson Moselle.  She was seen by Dr Vinson Moselle for pre-op evaluatin and EKG showed QT prolongation, so was referred to cardiology for further evaluation.  She is scheduled for knee surgery on 8/3.  For activity, she reports that she used to swim but has not done that since the pandemic started.  She goes for walks intermittently, will walk for up to 10 minutes but activity is limited by knee pain.  She says she can walk up a flight of stairs without stopping but has not done that recently as does not have stairs at her home.  She does state that she goes to her daughter's pool and walks around in the water as this is easier on her knee.  She denies any issues with exertional chest pain or dyspnea.  Denies any lightheadedness, syncope, palpitations, or lower extremity edema.  No smoking history.  Sister had MVR and died of cardiac arrest at age 25.  Father had MI in 71s.  Mother had MI in 54s.      Past Medical History:  Diagnosis Date  . Hypertension   . Hypochloremia     Past Surgical History:  Procedure Laterality Date  . BREAST BIOPSY Right 2004   neg  . CHOLECYSTECTOMY      Current Medications: Current Meds  Medication Sig  . acetaminophen (TYLENOL) 650 MG CR tablet Take 1,300 mg by mouth every 8 (eight) hours as needed for pain.  Marland Kitchen amLODipine (NORVASC) 5 MG tablet Take 5 mg by mouth daily.  Marland Kitchen aspirin 81 MG chewable tablet Baby Aspirin  . azelastine (ASTELIN) 0.1 %  nasal spray Place into both nostrils 2 (two) times daily. Use in each nostril as directed  . cetirizine (ZYRTEC) 10 MG tablet Take 10 mg by mouth daily.  . famotidine (PEPCID) 10 MG tablet   . hydrochlorothiazide (HYDRODIURIL) 25 MG tablet Take 25 mg by mouth daily.  . pravastatin (PRAVACHOL) 80 MG tablet Take 80 mg by mouth daily.  . Probiotic Product (PROBIOTIC DAILY PO) Take by mouth.  . ramipril (ALTACE) 5 MG capsule Take 5 mg by mouth 2 (two) times daily.   . Triamcinolone Acetonide (NASACORT ALLERGY 24HR NA) Place 2 sprays into the nose in the morning and at bedtime.      Allergies:   Atorvastatin and Sulfa antibiotics   Social History   Socioeconomic History  . Marital status: Married    Spouse name: Not on file  . Number of children: Not on file  . Years of education: Not on file  . Highest education level: Not on file  Occupational History  . Not on file  Tobacco Use  . Smoking status: Never Smoker  . Smokeless tobacco: Never Used  Substance and Sexual Activity  . Alcohol use: Yes    Comment: Occ.  . Drug use: Not on file  . Sexual activity: Not on file  Other  Topics Concern  . Not on file  Social History Narrative  . Not on file   Social Determinants of Health   Financial Resource Strain:   . Difficulty of Paying Living Expenses:   Food Insecurity:   . Worried About Programme researcher, broadcasting/film/video in the Last Year:   . Barista in the Last Year:   Transportation Needs:   . Freight forwarder (Medical):   Marland Kitchen Lack of Transportation (Non-Medical):   Physical Activity:   . Days of Exercise per Week:   . Minutes of Exercise per Session:   Stress:   . Feeling of Stress :   Social Connections:   . Frequency of Communication with Friends and Family:   . Frequency of Social Gatherings with Friends and Family:   . Attends Religious Services:   . Active Member of Clubs or Organizations:   . Attends Banker Meetings:   Marland Kitchen Marital Status:      Family  History: The patient's family history includes Heart disease in her father, mother, and sister. There is no history of Breast cancer.  ROS:   Please see the history of present illness.     All other systems reviewed and are negative.  EKGs/Labs/Other Studies Reviewed:    The following studies were reviewed today:   EKG:  EKG is ordered today.  The ekg ordered today demonstrates *normal sinus rhythm, sinus arrhythmia, rate 86, nonspecific T wave flattening, QTC 466  Recent Labs: No results found for requested labs within last 8760 hours.  Recent Lipid Panel No results found for: CHOL, TRIG, HDL, CHOLHDL, VLDL, LDLCALC, LDLDIRECT  Physical Exam:    VS:  BP (!) 142/80   Pulse 86   Temp (!) 96.9 F (36.1 C) (Other (Comment)) Comment (Src): Forehead  Ht 5' 6.5" (1.689 m)   Wt 199 lb 9.6 oz (90.5 kg)   SpO2 99%   BMI 31.73 kg/m     Wt Readings from Last 3 Encounters:  08/25/19 199 lb 9.6 oz (90.5 kg)  11/15/16 210 lb (95.3 kg)  04/08/15 220 lb (99.8 kg)     GEN:  Well nourished, well developed in no acute distress HEENT: Normal NECK: No JVD; No carotid bruits LYMPHATICS: No lymphadenopathy CARDIAC: RRR, 2/6 systolic heart murmur RESPIRATORY:  Clear to auscultation without rales, wheezing or rhonchi  ABDOMEN: Soft, non-tender, non-distended MUSCULOSKELETAL:  No edema; No deformity  SKIN: Warm and dry NEUROLOGIC:  Alert and oriented x 3 PSYCHIATRIC:  Normal affect   ASSESSMENT:    1. Heart murmur   2. Hypertension, unspecified type   3. Pre-op evaluation   4. QT prolongation   5. Hyperlipidemia, unspecified hyperlipidemia type    PLAN:    Heart murmur: 2 out of 6 systolic heart murmur.  Will check echocardiogram for further evaluation.  Preop evaluation: Prior to knee surgery.  No symptoms to suggest active cardiac condition.  Functional capacity has been limited by knee pain, but appears greater than 4 METS.  RCRI score 0, corresponding to 4% 30-day risk of  MI/cardiac arrest/death.  Overall would classify as low risk for an intermediate risk procedure.  We will plan echocardiogram as above to evaluate heart murmur, otherwise no further cardiac work-up planned prior to procedure.  QT prolongation: Noted on prior EKG at PCPs office, but I do not have that EKG.  QTc 466 on EKG today.  Will check electrolytes, TSH.  Hypertension: on HCTZ 25 mg daily, ramipril 10 mg  BID, amlodipine 5 mg daily  Hyperlipidemia: on pravastatin 80 mg daily  RTC in 6 months  Medication Adjustments/Labs and Tests Ordered: Current medicines are reviewed at length with the patient today.  Concerns regarding medicines are outlined above.  Orders Placed This Encounter  Procedures  . Basic metabolic panel  . Magnesium  . TSH  . EKG 12-Lead  . ECHOCARDIOGRAM COMPLETE   No orders of the defined types were placed in this encounter.   Patient Instructions  Medication Instructions:  Your physician recommends that you continue on your current medications as directed. Please refer to the Current Medication list given to you today.  Lab Work: Nutritional therapist, Mag, TSH today  If you have labs (blood work) drawn today and your tests are completely normal, you will receive your results only by: Marland Kitchen MyChart Message (if you have MyChart) OR . A paper copy in the mail If you have any lab test that is abnormal or we need to change your treatment, we will call you to review the results.   Testing/Procedures: Your physician has requested that you have an echocardiogram ASAP (surgery scheduled 8/3). Echocardiography is a painless test that uses sound waves to create images of your heart. It provides your doctor with information about the size and shape of your heart and how well your heart's chambers and valves are working. This procedure takes approximately one hour. There are no restrictions for this procedure.   Follow-Up: At [facility], you and your health needs are our priority.  As  part of our continuing mission to provide you with exceptional heart care, we have created designated Provider Care Teams.  These Care Teams include your primary Cardiologist (physician) and Advanced Practice Providers (APPs -  Physician Assistants and Nurse Practitioners) who all work together to provide you with the care you need, when you need it.  We recommend signing up for the patient portal called "MyChart".  Sign up information is provided on this After Visit Summary.  MyChart is used to connect with patients for Virtual Visits (Telemedicine).  Patients are able to view lab/test results, encounter notes, upcoming appointments, etc.  Non-urgent messages can be sent to your provider as well.   To learn more about what you can do with MyChart, go to [email or web address].    Your next appointment:   6 month(s)  The format for your next appointment:   In Person  Provider:   Epifanio Lesches, MD        Signed, Little Ishikawa, MD  08/25/2019 9:22 AM    [facility]

## 2019-08-29 ENCOUNTER — Other Ambulatory Visit: Payer: Self-pay

## 2019-08-29 ENCOUNTER — Ambulatory Visit (HOSPITAL_COMMUNITY)
Admission: RE | Admit: 2019-08-29 | Discharge: 2019-08-29 | Disposition: A | Discharge status: Home / Self Care | Payer: Medicare PPO | Source: Ambulatory Visit | Attending: Cardiology | Admitting: Cardiology

## 2019-08-29 DIAGNOSIS — R011 Cardiac murmur, unspecified: Secondary | ICD-10-CM | POA: Diagnosis not present

## 2019-08-29 DIAGNOSIS — I10 Essential (primary) hypertension: Secondary | ICD-10-CM | POA: Insufficient documentation

## 2019-08-29 LAB — ECHOCARDIOGRAM COMPLETE
Area-P 1/2: 2.37 cm2
S' Lateral: 3.2 cm

## 2019-08-29 NOTE — Progress Notes (Signed)
  Echocardiogram 2D Echocardiogram has been performed.  Tracey Rosales 08/29/2019, 3:50 PM

## 2019-09-05 NOTE — Patient Instructions (Addendum)
DUE TO COVID-19 ONLY ONE VISITOR IS ALLOWED TO COME WITH YOU AND STAY IN THE WAITING ROOM ONLY DURING PRE OP AND PROCEDURE DAY OF SURGERY. THE 2 VISITORS 7/30 MAY VISIT WITH YOU AFTER SURGERY IN YOUR PRIVATE ROOM DURING VISITING HOURS ONLY!  YOU NEED TO HAVE A COVID 19 TEST ON__7/30_____ @_2 :25______, THIS TEST MUST BE DONE BEFORE SURGERY, COME  [address], [place]  , .  [facility]) ONCE YOUR COVID TEST IS COMPLETED, PLEASE BEGIN THE QUARANTINE INSTRUCTIONS AS OUTLINED IN YOUR HANDOUT.                [facility] [place]    Your procedure is scheduled on: 09/13/19   Report to [facility] Main  Entrance   Report to admitting at  7:35 AM     Call this number if you have problems the morning of surgery [phone]    BRUSH YOUR TEETH MORNING OF SURGERY AND RINSE YOUR MOUTH OUT, NO CHEWING GUM CANDY OR MINTS.   Do not eat food After Midnight.   YOU MAY HAVE CLEAR LIQUIDS FROM MIDNIGHT UNTIL 7:00 AM.   At 7:00 AM Please finish the prescribed Pre-Surgery  Drink.   Nothing by mouth after you finish the drink !    Take these medicines the morning of surgery with A SIP OF WATER: Zyrtec, Amlodipine                                 You may not have any metal on your body including hair pins and              piercings  Do not wear jewelry, make-up, lotions, powders or perfumes, deodorant             Do not wear nail polish on your fingernails.  Do not shave  48 hours prior to surgery.     Do not bring valuables to the hospital. [facility] IS NOT             RESPONSIBLE   FOR VALUABLES.  Contacts, dentures or bridgework may not be worn into surgery.      Patients discharged the day of surgery will not be allowed to drive home.     IF YOU ARE HAVING SURGERY AND GOING HOME THE SAME DAY, YOU MUST HAVE AN ADULT TO DRIVE YOU HOME AND BE WITH YOU FOR 24 HOURS.  YOU MAY GO HOME BY TAXI OR UBER OR ORTHERWISE, BUT AN ADULT MUST ACCOMPANY YOU HOME AND  STAY WITH YOU FOR 24 HOURS.  Name and phone number of your driver:  Special Instructions: N/A              Please read over the following fact sheets you were given: _____________________________________________________________________             [facility] - Preparing for Surgery  Before surgery, you can play an important role.   Because skin is not sterile, your skin needs to be as free of germs as possible.   You can reduce the number of germs on your skin by washing with CHG (chlorahexidine gluconate) soap before surgery.   CHG is an antiseptic cleaner which kills germs and bonds with the skin to continue killing germs even after washing. Please DO NOT use if you have an allergy to CHG or antibacterial soaps.   If your skin becomes reddened/irritated stop using  the CHG and inform your nurse when you arrive at Short Stay. Do not shave (including legs and underarms) for at least 48 hours prior to the first CHG shower.    Please follow these instructions carefully:  1.  Shower with CHG Soap the night before surgery and the  morning of Surgery.  2.  If you choose to wash your hair, wash your hair first as usual with your  normal  shampoo.  3.  After you shampoo, rinse your hair and body thoroughly to remove the  shampoo.                                        4.  Use CHG as you would any other liquid soap.  You can apply chg directly  to the skin and wash                       Gently with a scrungie or clean washcloth.  5.  Apply the CHG Soap to your body ONLY FROM THE NECK DOWN.   Do not use on face/ open                           Wound or open sores. Avoid contact with eyes, ears mouth and genitals (private parts).                       Wash face,  Genitals (private parts) with your normal soap.             6.  Wash thoroughly, paying special attention to the area where your surgery  will be performed.  7.  Thoroughly rinse your body with warm water from the neck down.  8.  DO NOT  shower/wash with your normal soap after using and rinsing off  the CHG Soap.             9.  Pat yourself dry with a clean towel.            10.  Wear clean pajamas.            11.  Place clean sheets on your bed the night of your first shower and do not  sleep with pets. Day of Surgery : Do not apply any lotions/deodorants the morning of surgery.  Please wear clean clothes to the hospital/surgery center.  FAILURE TO FOLLOW THESE INSTRUCTIONS MAY RESULT IN THE CANCELLATION OF YOUR SURGERY PATIENT SIGNATURE_________________________________  NURSE SIGNATURE__________________________________  ________________________________________________________________________   Tracey Rosales  An incentive spirometer is a tool that can help keep your lungs clear and active. This tool measures how well you are filling your lungs with each breath. Taking long deep breaths may help reverse or decrease the chance of developing breathing (pulmonary) problems (especially infection) following:  A long period of time when you are unable to move or be active. BEFORE THE PROCEDURE   If the spirometer includes an indicator to show your best effort, your nurse or respiratory therapist will set it to a desired goal.  If possible, sit up straight or lean slightly forward. Try not to slouch.  Hold the incentive spirometer in an upright position. INSTRUCTIONS FOR USE  1. Sit on the edge of your bed if possible, or sit up as far as you can in bed or on a  chair. 2. Hold the incentive spirometer in an upright position. 3. Breathe out normally. 4. Place the mouthpiece in your mouth and seal your lips tightly around it. 5. Breathe in slowly and as deeply as possible, raising the piston or the ball toward the top of the column. 6. Hold your breath for 3-5 seconds or for as long as possible. Allow the piston or ball to fall to the bottom of the column. 7. Remove the mouthpiece from your mouth and breathe out  normally. 8. Rest for a few seconds and repeat Steps 1 through 7 at least 10 times every 1-2 hours when you are awake. Take your time and take a few normal breaths between deep breaths. 9. The spirometer may include an indicator to show your best effort. Use the indicator as a goal to work toward during each repetition. 10. After each set of 10 deep breaths, practice coughing to be sure your lungs are clear. If you have an incision (the cut made at the time of surgery), support your incision when coughing by placing a pillow or rolled up towels firmly against it. Once you are able to get out of bed, walk around indoors and cough well. You may stop using the incentive spirometer when instructed by your caregiver.  RISKS AND COMPLICATIONS  Take your time so you do not get dizzy or light-headed.  If you are in pain, you may need to take or ask for pain medication before doing incentive spirometry. It is harder to take a deep breath if you are having pain. AFTER USE  Rest and breathe slowly and easily.  It can be helpful to keep track of a log of your progress. Your caregiver can provide you with a simple table to help with this. If you are using the spirometer at home, follow these instructions: SEEK MEDICAL CARE IF:   You are having difficultly using the spirometer.  You have trouble using the spirometer as often as instructed.  Your pain medication is not giving enough relief while using the spirometer.  You develop fever of 100.5 F (38.1 C) or higher. SEEK IMMEDIATE MEDICAL CARE IF:   You cough up bloody sputum that had not been present before.  You develop fever of 102 F (38.9 C) or greater.  You develop worsening pain at or near the incision site. MAKE SURE YOU:   Understand these instructions.  Will watch your condition.  Will get help right away if you are not doing well or get worse. Document Released: 06/09/2006 Document Revised: 04/21/2011 Document Reviewed:  08/10/2006 [facility] Patient Information 2014 [place], [place].   ________________________________________________________________________

## 2019-09-06 ENCOUNTER — Encounter (HOSPITAL_COMMUNITY)
Admission: RE | Admit: 2019-09-06 | Discharge: 2019-09-06 | Disposition: A | Discharge status: Home / Self Care | Payer: Medicare PPO | Source: Ambulatory Visit | Attending: Orthopedic Surgery | Admitting: Orthopedic Surgery

## 2019-09-06 ENCOUNTER — Encounter (HOSPITAL_COMMUNITY): Payer: Self-pay

## 2019-09-06 ENCOUNTER — Other Ambulatory Visit: Payer: Self-pay

## 2019-09-06 DIAGNOSIS — Z01812 Encounter for preprocedural laboratory examination: Secondary | ICD-10-CM | POA: Insufficient documentation

## 2019-09-06 HISTORY — DX: Unspecified osteoarthritis, unspecified site: M19.90

## 2019-09-06 HISTORY — DX: Gastro-esophageal reflux disease without esophagitis: K21.9

## 2019-09-06 HISTORY — DX: Nausea with vomiting, unspecified: R11.2

## 2019-09-06 HISTORY — DX: Other specified postprocedural states: Z98.890

## 2019-09-06 LAB — CBC
HCT: 43.2 % (ref 36.0–46.0)
Hemoglobin: 14.1 g/dL (ref 12.0–15.0)
MCH: 28.1 pg (ref 26.0–34.0)
MCHC: 32.6 g/dL (ref 30.0–36.0)
MCV: 86.1 fL (ref 80.0–100.0)
Platelets: 328 10*3/uL (ref 150–400)
RBC: 5.02 MIL/uL (ref 3.87–5.11)
RDW: 13.6 % (ref 11.5–15.5)
WBC: 9 10*3/uL (ref 4.0–10.5)
nRBC: 0 % (ref 0.0–0.2)

## 2019-09-06 LAB — BASIC METABOLIC PANEL
Anion gap: 11 (ref 5–15)
BUN: 13 mg/dL (ref 8–23)
CO2: 28 mmol/L (ref 22–32)
Calcium: 9.9 mg/dL (ref 8.9–10.3)
Chloride: 103 mmol/L (ref 98–111)
Creatinine, Ser: 0.52 mg/dL (ref 0.44–1.00)
GFR calc Af Amer: >60 mL/min (ref >60)
GFR calc non Af Amer: >60 mL/min (ref >60)
Glucose, Bld: 98 mg/dL (ref 70–99)
Potassium: 3.9 mmol/L (ref 3.5–5.1)
Sodium: 142 mmol/L (ref 135–145)

## 2019-09-06 LAB — SURGICAL PCR SCREEN
MRSA, PCR: NEGATIVE
Staphylococcus aureus: NEGATIVE

## 2019-09-06 NOTE — Progress Notes (Addendum)
COVID Vaccine Completed:Yes Date COVID Vaccine completed:04/05/19 COVID vaccine manufacturer:  Moderna      PCP - Dr. Suzanne Boron Cardiologist - Dr. Jaclyn Shaggy  Chest x-ray - no EKG - 08/26/19 Stress Test - no ECHO - 08/29/19 Cardiac Cath - no  Sleep Study - no CPAP -   Fasting Blood Sugar - NA Checks Blood Sugar _____ times a day  Blood Thinner Instructions:ASA/[place] Aspirin Instructions:Stop 7 days prior to DOS/ Charlann Boxer Last Dose:09/04/19  Anesthesia review:   Patient denies shortness of breath, fever, cough and chest pain at PAT appointment  yes   Patient verbalized understanding of instructions that were given to them at the PAT appointment. Patient was also instructed that they will need to review over the PAT instructions again at home before surgery.  Yes  Pt was swimming and walking regularly prior to knee pain. She was able to climb stairs 2-3 flights before ant SOB. No SOB with ADLs. Pt has a heart murmur and an echo was done 7/19/2.

## 2019-09-09 ENCOUNTER — Other Ambulatory Visit (HOSPITAL_COMMUNITY): Payer: Medicare PPO

## 2019-09-09 ENCOUNTER — Other Ambulatory Visit (HOSPITAL_COMMUNITY)
Admission: RE | Admit: 2019-09-09 | Discharge: 2019-09-09 | Disposition: A | Discharge status: Home / Self Care | Payer: Medicare PPO | Source: Ambulatory Visit | Attending: Orthopedic Surgery | Admitting: Orthopedic Surgery

## 2019-09-09 DIAGNOSIS — Z01812 Encounter for preprocedural laboratory examination: Secondary | ICD-10-CM | POA: Diagnosis present

## 2019-09-09 DIAGNOSIS — Z20822 Contact with and (suspected) exposure to covid-19: Secondary | ICD-10-CM | POA: Diagnosis not present

## 2019-09-09 LAB — SARS CORONAVIRUS 2 (TAT 6-24 HRS): SARS Coronavirus 2: NEGATIVE

## 2019-09-13 ENCOUNTER — Ambulatory Visit (HOSPITAL_COMMUNITY): Payer: Medicare PPO | Admitting: Certified Registered Nurse Anesthetist

## 2019-09-13 ENCOUNTER — Encounter (HOSPITAL_COMMUNITY)
Admission: RE | Disposition: A | Discharge status: Home / Self Care | Payer: Self-pay | Source: Home / Self Care | Attending: Orthopedic Surgery

## 2019-09-13 ENCOUNTER — Other Ambulatory Visit: Payer: Self-pay

## 2019-09-13 ENCOUNTER — Encounter (HOSPITAL_COMMUNITY): Payer: Self-pay | Admitting: Orthopedic Surgery

## 2019-09-13 ENCOUNTER — Observation Stay (HOSPITAL_COMMUNITY)
Admission: RE | Admit: 2019-09-13 | Discharge: 2019-09-14 | Disposition: A | Discharge status: Home / Self Care | Payer: Medicare PPO | Attending: Orthopedic Surgery | Admitting: Orthopedic Surgery

## 2019-09-13 DIAGNOSIS — R12 Heartburn: Secondary | ICD-10-CM | POA: Insufficient documentation

## 2019-09-13 DIAGNOSIS — M1711 Unilateral primary osteoarthritis, right knee: Secondary | ICD-10-CM | POA: Diagnosis not present

## 2019-09-13 DIAGNOSIS — Z96651 Presence of right artificial knee joint: Secondary | ICD-10-CM | POA: Diagnosis not present

## 2019-09-13 DIAGNOSIS — M25561 Pain in right knee: Secondary | ICD-10-CM | POA: Diagnosis present

## 2019-09-13 DIAGNOSIS — Z20822 Contact with and (suspected) exposure to covid-19: Secondary | ICD-10-CM | POA: Insufficient documentation

## 2019-09-13 DIAGNOSIS — Z7982 Long term (current) use of aspirin: Secondary | ICD-10-CM | POA: Diagnosis not present

## 2019-09-13 DIAGNOSIS — I10 Essential (primary) hypertension: Secondary | ICD-10-CM | POA: Insufficient documentation

## 2019-09-13 DIAGNOSIS — Z79899 Other long term (current) drug therapy: Secondary | ICD-10-CM | POA: Diagnosis not present

## 2019-09-13 DIAGNOSIS — Z96659 Presence of unspecified artificial knee joint: Secondary | ICD-10-CM

## 2019-09-13 HISTORY — PX: TOTAL KNEE ARTHROPLASTY: SHX125

## 2019-09-13 LAB — TYPE AND SCREEN
ABO/RH(D): A POS
Antibody Screen: NEGATIVE

## 2019-09-13 LAB — ABO/RH: ABO/RH(D): A POS

## 2019-09-13 SURGERY — ARTHROPLASTY, KNEE, TOTAL
Anesthesia: Spinal | Site: Knee | Laterality: Right

## 2019-09-13 MED ORDER — ONDANSETRON HCL 4 MG/2ML IJ SOLN
INTRAMUSCULAR | Status: AC
  Filled 2019-09-13: qty 2

## 2019-09-13 MED ORDER — MIDAZOLAM HCL 2 MG/2ML IJ SOLN
INTRAMUSCULAR | Status: AC
  Administered 2019-09-13: 1 mg
  Filled 2019-09-13: qty 2

## 2019-09-13 MED ORDER — CEFAZOLIN SODIUM-DEXTROSE 2-4 GM/100ML-% IV SOLN
2.0000 g | Freq: Four times a day (QID) | INTRAVENOUS | Status: AC
  Administered 2019-09-13 (×2): 2 g via INTRAVENOUS
  Filled 2019-09-13 (×2): qty 100

## 2019-09-13 MED ORDER — DOCUSATE SODIUM 100 MG PO CAPS
100.0000 mg | ORAL_CAPSULE | Freq: Two times a day (BID) | ORAL | Status: DC
  Administered 2019-09-13 – 2019-09-14 (×2): 100 mg via ORAL
  Filled 2019-09-13 (×2): qty 1

## 2019-09-13 MED ORDER — DEXAMETHASONE SODIUM PHOSPHATE 10 MG/ML IJ SOLN
10.0000 mg | Freq: Once | INTRAMUSCULAR | Status: AC
  Administered 2019-09-13: 10 mg via INTRAVENOUS

## 2019-09-13 MED ORDER — TRIAMCINOLONE ACETONIDE 55 MCG/ACT NA AERO
2.0000 | INHALATION_SPRAY | Freq: Two times a day (BID) | NASAL | Status: DC
  Administered 2019-09-13 – 2019-09-14 (×2): 2 via NASAL
  Filled 2019-09-13: qty 21.6

## 2019-09-13 MED ORDER — PHENYLEPHRINE HCL-NACL 10-0.9 MG/250ML-% IV SOLN
INTRAVENOUS | Status: DC | PRN
Start: 2019-09-13 — End: 2019-09-13
  Administered 2019-09-13: 30 ug/min via INTRAVENOUS

## 2019-09-13 MED ORDER — ONDANSETRON HCL 4 MG/2ML IJ SOLN
INTRAMUSCULAR | Status: DC | PRN
  Administered 2019-09-13: 4 mg via INTRAVENOUS

## 2019-09-13 MED ORDER — LACTATED RINGERS IV SOLN: INTRAVENOUS | Status: DC

## 2019-09-13 MED ORDER — ONDANSETRON HCL 4 MG/2ML IJ SOLN: 4.0000 mg | Freq: Four times a day (QID) | INTRAMUSCULAR | Status: DC | PRN

## 2019-09-13 MED ORDER — SODIUM CHLORIDE 0.9 % IV BOLUS
500.0000 mL | Freq: Once | INTRAVENOUS | Status: AC
  Administered 2019-09-13: 500 mL via INTRAVENOUS

## 2019-09-13 MED ORDER — PROPOFOL 10 MG/ML IV BOLUS
INTRAVENOUS | Status: DC | PRN
  Administered 2019-09-13 (×2): 10 mg via INTRAVENOUS
  Administered 2019-09-13: 20 mg via INTRAVENOUS
  Administered 2019-09-13 (×4): 10 mg via INTRAVENOUS
  Administered 2019-09-13: 20 mg via INTRAVENOUS
  Administered 2019-09-13: 10 mg via INTRAVENOUS

## 2019-09-13 MED ORDER — PROPOFOL 10 MG/ML IV BOLUS
INTRAVENOUS | Status: AC
  Filled 2019-09-13: qty 20

## 2019-09-13 MED ORDER — RAMIPRIL 5 MG PO CAPS
5.0000 mg | ORAL_CAPSULE | Freq: Two times a day (BID) | ORAL | Status: DC
  Administered 2019-09-13: 5 mg via ORAL
  Filled 2019-09-13 (×2): qty 1

## 2019-09-13 MED ORDER — ONDANSETRON HCL 4 MG PO TABS
4.0000 mg | ORAL_TABLET | Freq: Four times a day (QID) | ORAL | Status: DC | PRN
  Administered 2019-09-14: 4 mg via ORAL
  Filled 2019-09-13: qty 1

## 2019-09-13 MED ORDER — TRANEXAMIC ACID-NACL 1000-0.7 MG/100ML-% IV SOLN
1000.0000 mg | Freq: Once | INTRAVENOUS | Status: AC
  Administered 2019-09-13: 1000 mg via INTRAVENOUS
  Filled 2019-09-13: qty 100

## 2019-09-13 MED ORDER — ALBUMIN HUMAN 5 % IV SOLN
INTRAVENOUS | Status: AC
  Filled 2019-09-13: qty 250

## 2019-09-13 MED ORDER — FAMOTIDINE 20 MG PO TABS
10.0000 mg | ORAL_TABLET | Freq: Every day | ORAL | Status: DC
  Administered 2019-09-13 – 2019-09-14 (×2): 10 mg via ORAL
  Filled 2019-09-13 (×2): qty 1

## 2019-09-13 MED ORDER — MENTHOL 3 MG MT LOZG: 1.0000 | LOZENGE | OROMUCOSAL | Status: DC | PRN

## 2019-09-13 MED ORDER — POLYETHYLENE GLYCOL 3350 17 G PO PACK
17.0000 g | PACK | Freq: Two times a day (BID) | ORAL | Status: DC
  Filled 2019-09-13 (×2): qty 1

## 2019-09-13 MED ORDER — MORPHINE SULFATE (PF) 2 MG/ML IV SOLN: 0.5000 mg | INTRAVENOUS | Status: DC | PRN

## 2019-09-13 MED ORDER — PROPOFOL 500 MG/50ML IV EMUL
INTRAVENOUS | Status: DC | PRN
  Administered 2019-09-13: 50 ug/kg/min via INTRAVENOUS

## 2019-09-13 MED ORDER — ACETAMINOPHEN 325 MG PO TABS: 325.0000 mg | ORAL_TABLET | Freq: Four times a day (QID) | ORAL | Status: DC | PRN

## 2019-09-13 MED ORDER — TRAMADOL HCL 50 MG PO TABS
50.0000 mg | ORAL_TABLET | Freq: Four times a day (QID) | ORAL | Status: DC | PRN
  Administered 2019-09-13: 50 mg via ORAL
  Administered 2019-09-14 (×2): 100 mg via ORAL
  Filled 2019-09-13 (×3): qty 2

## 2019-09-13 MED ORDER — POVIDONE-IODINE 10 % EX SWAB
2.0000 "application " | Freq: Once | CUTANEOUS | Status: AC
  Administered 2019-09-13: 2 via TOPICAL

## 2019-09-13 MED ORDER — MAGNESIUM CITRATE PO SOLN: 1.0000 | Freq: Once | ORAL | Status: DC | PRN

## 2019-09-13 MED ORDER — BUPIVACAINE IN DEXTROSE 0.75-8.25 % IT SOLN
INTRATHECAL | Status: DC | PRN
Start: 2019-09-13 — End: 2019-09-13
  Administered 2019-09-13: 1.6 mL via INTRATHECAL

## 2019-09-13 MED ORDER — SODIUM CHLORIDE 0.9 % IV SOLN: INTRAVENOUS | Status: DC

## 2019-09-13 MED ORDER — FERROUS SULFATE 325 (65 FE) MG PO TABS
325.0000 mg | ORAL_TABLET | Freq: Two times a day (BID) | ORAL | Status: DC
  Administered 2019-09-13: 325 mg via ORAL
  Filled 2019-09-13 (×2): qty 1

## 2019-09-13 MED ORDER — CEFAZOLIN SODIUM-DEXTROSE 2-4 GM/100ML-% IV SOLN
2.0000 g | INTRAVENOUS | Status: AC
  Administered 2019-09-13: 2 g via INTRAVENOUS
  Filled 2019-09-13: qty 100

## 2019-09-13 MED ORDER — METHOCARBAMOL 500 MG PO TABS
500.0000 mg | ORAL_TABLET | Freq: Four times a day (QID) | ORAL | Status: DC | PRN
  Administered 2019-09-13 – 2019-09-14 (×2): 500 mg via ORAL
  Filled 2019-09-13 (×2): qty 1

## 2019-09-13 MED ORDER — BISACODYL 10 MG RE SUPP: 10.0000 mg | Freq: Every day | RECTAL | Status: DC | PRN

## 2019-09-13 MED ORDER — DIPHENHYDRAMINE HCL 12.5 MG/5ML PO ELIX: 12.5000 mg | ORAL_SOLUTION | ORAL | Status: DC | PRN

## 2019-09-13 MED ORDER — CALCIUM CHLORIDE 10 % IV SOLN
INTRAVENOUS | Status: AC
  Filled 2019-09-13: qty 10

## 2019-09-13 MED ORDER — PHENYLEPHRINE HCL (PRESSORS) 10 MG/ML IV SOLN
INTRAVENOUS | Status: AC
  Filled 2019-09-13: qty 1

## 2019-09-13 MED ORDER — LORATADINE 10 MG PO TABS
10.0000 mg | ORAL_TABLET | Freq: Every day | ORAL | Status: DC
  Administered 2019-09-13 – 2019-09-14 (×2): 10 mg via ORAL
  Filled 2019-09-13 (×2): qty 1

## 2019-09-13 MED ORDER — PHENOL 1.4 % MT LIQD: 1.0000 | OROMUCOSAL | Status: DC | PRN

## 2019-09-13 MED ORDER — SODIUM CHLORIDE 0.9 % IR SOLN
Status: DC | PRN
  Administered 2019-09-13: 1000 mL

## 2019-09-13 MED ORDER — CHLORHEXIDINE GLUCONATE 0.12 % MT SOLN
15.0000 mL | Freq: Once | OROMUCOSAL | Status: AC
  Administered 2019-09-13: 15 mL via OROMUCOSAL

## 2019-09-13 MED ORDER — BUPIVACAINE HCL (PF) 0.5 % IJ SOLN
INTRAMUSCULAR | Status: DC | PRN
  Administered 2019-09-13: 20 mL via PERINEURAL

## 2019-09-13 MED ORDER — ACETAMINOPHEN 500 MG PO TABS: 500.0000 mg | ORAL_TABLET | Freq: Four times a day (QID) | ORAL | Status: DC

## 2019-09-13 MED ORDER — METOCLOPRAMIDE HCL 5 MG PO TABS: 5.0000 mg | ORAL_TABLET | Freq: Three times a day (TID) | ORAL | Status: DC | PRN

## 2019-09-13 MED ORDER — ASPIRIN 81 MG PO CHEW
81.0000 mg | CHEWABLE_TABLET | Freq: Two times a day (BID) | ORAL | Status: DC
  Administered 2019-09-13 – 2019-09-14 (×2): 81 mg via ORAL
  Filled 2019-09-13 (×2): qty 1

## 2019-09-13 MED ORDER — AMLODIPINE BESYLATE 5 MG PO TABS
5.0000 mg | ORAL_TABLET | Freq: Every day | ORAL | Status: DC
  Filled 2019-09-13: qty 1

## 2019-09-13 MED ORDER — METOCLOPRAMIDE HCL 5 MG/ML IJ SOLN: 5.0000 mg | Freq: Three times a day (TID) | INTRAMUSCULAR | Status: DC | PRN

## 2019-09-13 MED ORDER — METHOCARBAMOL 500 MG IVPB - SIMPLE MED
500.0000 mg | Freq: Four times a day (QID) | INTRAVENOUS | Status: DC | PRN
  Filled 2019-09-13: qty 50

## 2019-09-13 MED ORDER — AZELASTINE HCL 0.1 % NA SOLN
1.0000 | Freq: Two times a day (BID) | NASAL | Status: DC
  Administered 2019-09-13 – 2019-09-14 (×2): 1 via NASAL
  Filled 2019-09-13: qty 30

## 2019-09-13 MED ORDER — KETOROLAC TROMETHAMINE 30 MG/ML IJ SOLN
INTRAMUSCULAR | Status: DC | PRN
  Administered 2019-09-13: 30 mg

## 2019-09-13 MED ORDER — SODIUM CHLORIDE (PF) 0.9 % IJ SOLN
INTRAMUSCULAR | Status: AC
  Filled 2019-09-13: qty 50

## 2019-09-13 MED ORDER — ORAL CARE MOUTH RINSE: 15.0000 mL | Freq: Once | OROMUCOSAL | Status: AC

## 2019-09-13 MED ORDER — DEXAMETHASONE SODIUM PHOSPHATE 10 MG/ML IJ SOLN
10.0000 mg | Freq: Once | INTRAMUSCULAR | Status: AC
  Administered 2019-09-14: 10 mg via INTRAVENOUS
  Filled 2019-09-13: qty 1

## 2019-09-13 MED ORDER — KETOROLAC TROMETHAMINE 30 MG/ML IJ SOLN
INTRAMUSCULAR | Status: AC
  Filled 2019-09-13: qty 1

## 2019-09-13 MED ORDER — BUPIVACAINE-EPINEPHRINE (PF) 0.25% -1:200000 IJ SOLN
INTRAMUSCULAR | Status: AC
  Filled 2019-09-13: qty 30

## 2019-09-13 MED ORDER — HYDROCHLOROTHIAZIDE 25 MG PO TABS
25.0000 mg | ORAL_TABLET | Freq: Every day | ORAL | Status: DC
  Filled 2019-09-13: qty 1

## 2019-09-13 MED ORDER — FENTANYL CITRATE (PF) 100 MCG/2ML IJ SOLN
INTRAMUSCULAR | Status: AC
  Administered 2019-09-13: 50 ug
  Filled 2019-09-13: qty 2

## 2019-09-13 MED ORDER — HYDROMORPHONE HCL 1 MG/ML IJ SOLN: 0.2500 mg | INTRAMUSCULAR | Status: DC | PRN

## 2019-09-13 MED ORDER — PRAVASTATIN SODIUM 20 MG PO TABS
80.0000 mg | ORAL_TABLET | Freq: Every day | ORAL | Status: DC
  Administered 2019-09-13: 80 mg via ORAL
  Filled 2019-09-13: qty 4

## 2019-09-13 MED ORDER — SODIUM CHLORIDE (PF) 0.9 % IJ SOLN
INTRAMUSCULAR | Status: DC | PRN
  Administered 2019-09-13: 30 mL

## 2019-09-13 MED ORDER — TRANEXAMIC ACID-NACL 1000-0.7 MG/100ML-% IV SOLN
1000.0000 mg | INTRAVENOUS | Status: AC
  Administered 2019-09-13: 1000 mg via INTRAVENOUS
  Filled 2019-09-13: qty 100

## 2019-09-13 MED ORDER — BUPIVACAINE-EPINEPHRINE (PF) 0.25% -1:200000 IJ SOLN
INTRAMUSCULAR | Status: DC | PRN
  Administered 2019-09-13: 30 mL

## 2019-09-13 MED ORDER — ALUM & MAG HYDROXIDE-SIMETH 200-200-20 MG/5ML PO SUSP: 15.0000 mL | ORAL | Status: DC | PRN

## 2019-09-13 MED ORDER — HYDROCODONE-ACETAMINOPHEN 5-325 MG PO TABS
1.0000 | ORAL_TABLET | ORAL | Status: DC | PRN
  Administered 2019-09-13: 1 via ORAL
  Administered 2019-09-13: 2 via ORAL
  Administered 2019-09-13: 1 via ORAL
  Administered 2019-09-14 (×2): 2 via ORAL
  Filled 2019-09-13 (×2): qty 1
  Filled 2019-09-13 (×3): qty 2

## 2019-09-13 MED ORDER — DEXAMETHASONE SODIUM PHOSPHATE 10 MG/ML IJ SOLN
INTRAMUSCULAR | Status: AC
  Filled 2019-09-13: qty 1

## 2019-09-13 MED ORDER — STERILE WATER FOR IRRIGATION IR SOLN
Status: DC | PRN
  Administered 2019-09-13 (×2): 1000 mL

## 2019-09-13 MED ORDER — PROPOFOL 500 MG/50ML IV EMUL
INTRAVENOUS | Status: AC
  Filled 2019-09-13: qty 50

## 2019-09-13 SURGICAL SUPPLY — 63 items
ATTUNE MED ANAT PAT 35 KNEE (Knees) ×2 IMPLANT
ATTUNE MED ANAT PAT 35MM KNEE (Knees) ×1 IMPLANT
ATTUNE PSFEM RTSZ5 NARCEM KNEE (Femur) ×3 IMPLANT
ATTUNE PSRP INSR SZ5 8 KNEE (Insert) ×2 IMPLANT
ATTUNE PSRP INSR SZ5 8MM KNEE (Insert) ×1 IMPLANT
BAG ZIPLOCK 12X15 (MISCELLANEOUS) IMPLANT
BASE TIBIAL ROT PLAT SZ 5 KNEE (Knees) ×1 IMPLANT
BLADE SAW SGTL 11.0X1.19X90.0M (BLADE) IMPLANT
BLADE SAW SGTL 13.0X1.19X90.0M (BLADE) ×3 IMPLANT
BLADE SURG SZ10 CARB STEEL (BLADE) ×6 IMPLANT
BNDG ELASTIC 6X5.8 VLCR STR LF (GAUZE/BANDAGES/DRESSINGS) ×3 IMPLANT
BOWL SMART MIX CTS (DISPOSABLE) ×3 IMPLANT
CEMENT HV SMART SET (Cement) ×6 IMPLANT
COVER SURGICAL LIGHT HANDLE (MISCELLANEOUS) ×3 IMPLANT
COVER WAND RF STERILE (DRAPES) IMPLANT
CUFF TOURN SGL QUICK 34 (TOURNIQUET CUFF) ×2
CUFF TRNQT CYL 34X4.125X (TOURNIQUET CUFF) ×1 IMPLANT
DECANTER SPIKE VIAL GLASS SM (MISCELLANEOUS) ×6 IMPLANT
DERMABOND ADVANCED (GAUZE/BANDAGES/DRESSINGS) ×2
DERMABOND ADVANCED .7 DNX12 (GAUZE/BANDAGES/DRESSINGS) ×1 IMPLANT
DRAPE U-SHAPE 47X51 STRL (DRAPES) ×3 IMPLANT
DRESSING AQUACEL AG SP 3.5X10 (GAUZE/BANDAGES/DRESSINGS) ×1 IMPLANT
DRSG AQUACEL AG ADV 3.5X10 (GAUZE/BANDAGES/DRESSINGS) ×3 IMPLANT
DRSG AQUACEL AG SP 3.5X10 (GAUZE/BANDAGES/DRESSINGS) ×3
DURAPREP 26ML APPLICATOR (WOUND CARE) ×6 IMPLANT
ELECT REM PT RETURN 15FT ADLT (MISCELLANEOUS) ×3 IMPLANT
GLOVE BIO SURGEON STRL SZ 6 (GLOVE) ×3 IMPLANT
GLOVE BIOGEL PI IND STRL 6.5 (GLOVE) ×1 IMPLANT
GLOVE BIOGEL PI IND STRL 7.5 (GLOVE) ×1 IMPLANT
GLOVE BIOGEL PI IND STRL 8.5 (GLOVE) ×1 IMPLANT
GLOVE BIOGEL PI INDICATOR 6.5 (GLOVE) ×2
GLOVE BIOGEL PI INDICATOR 7.5 (GLOVE) ×2
GLOVE BIOGEL PI INDICATOR 8.5 (GLOVE) ×2
GLOVE ECLIPSE 8.0 STRL XLNG CF (GLOVE) ×3 IMPLANT
GLOVE ORTHO TXT STRL SZ7.5 (GLOVE) ×3 IMPLANT
GOWN STRL REUS W/ TWL LRG LVL3 (GOWN DISPOSABLE) ×1 IMPLANT
GOWN STRL REUS W/TWL 2XL LVL3 (GOWN DISPOSABLE) ×3 IMPLANT
GOWN STRL REUS W/TWL LRG LVL3 (GOWN DISPOSABLE) ×5 IMPLANT
HANDPIECE INTERPULSE COAX TIP (DISPOSABLE) ×2
HOLDER FOLEY CATH W/STRAP (MISCELLANEOUS) IMPLANT
KIT TURNOVER KIT A (KITS) IMPLANT
MANIFOLD NEPTUNE II (INSTRUMENTS) ×3 IMPLANT
NDL SAFETY ECLIPSE 18X1.5 (NEEDLE) IMPLANT
NEEDLE HYPO 18GX1.5 SHARP (NEEDLE)
NS IRRIG 1000ML POUR BTL (IV SOLUTION) ×3 IMPLANT
PACK TOTAL KNEE CUSTOM (KITS) ×3 IMPLANT
PENCIL SMOKE EVACUATOR (MISCELLANEOUS) IMPLANT
PIN DRILL FIX HALF THREAD (BIT) ×3 IMPLANT
PIN FIX SIGMA LCS THRD HI (PIN) ×3 IMPLANT
PROTECTOR NERVE ULNAR (MISCELLANEOUS) ×3 IMPLANT
SET HNDPC FAN SPRY TIP SCT (DISPOSABLE) ×1 IMPLANT
SET PAD KNEE POSITIONER (MISCELLANEOUS) ×3 IMPLANT
SUT MNCRL AB 4-0 PS2 18 (SUTURE) ×3 IMPLANT
SUT STRATAFIX PDS+ 0 24IN (SUTURE) ×3 IMPLANT
SUT VIC AB 1 CT1 36 (SUTURE) ×3 IMPLANT
SUT VIC AB 2-0 CT1 27 (SUTURE) ×6
SUT VIC AB 2-0 CT1 TAPERPNT 27 (SUTURE) ×3 IMPLANT
SYR 3ML LL SCALE MARK (SYRINGE) ×3 IMPLANT
TIBIAL BASE ROT PLAT SZ 5 KNEE (Knees) ×3 IMPLANT
TRAY FOLEY MTR SLVR 16FR STAT (SET/KITS/TRAYS/PACK) ×3 IMPLANT
WATER STERILE IRR 1000ML POUR (IV SOLUTION) ×6 IMPLANT
WRAP KNEE MAXI GEL POST OP (GAUZE/BANDAGES/DRESSINGS) ×3 IMPLANT
YANKAUER SUCT BULB TIP 10FT TU (MISCELLANEOUS) ×3 IMPLANT

## 2019-09-13 NOTE — Anesthesia Procedure Notes (Signed)
Anesthesia Procedure Image    

## 2019-09-13 NOTE — Op Note (Signed)
NAME:  Tracey Rosales                      MEDICAL RECORD NO.:  [id number]                             FACILITY:  [facility]      PHYSICIAN:  Madlyn Frankel. Charlann Boxer, M.D.  DATE OF BIRTH:  Jan 10, 1951      DATE OF PROCEDURE:  09/13/2019                                     OPERATIVE REPORT         PREOPERATIVE DIAGNOSIS:  Right knee osteoarthritis.      POSTOPERATIVE DIAGNOSIS:  Right knee osteoarthritis.      FINDINGS:  The patient was noted to have complete loss of cartilage and   bone-on-bone arthritis with associated osteophytes in the medial and patellofemoral compartments of   the knee with a significant synovitis and associated effusion.  The patient had failed months of conservative treatment including medications, injection therapy, activity modification.     PROCEDURE:  Right total knee replacement.      COMPONENTS USED:  DePuy Attune rotating platform posterior stabilized knee   system, a size 5N femur, 5 tibia, size 8 mm PS AOX insert, and 35 anatomic patellar   button.      SURGEON:  Madlyn Frankel. Charlann Boxer, M.D.      ASSISTANT:  Dennie Bible, PA-C.      ANESTHESIA:  Regional and Spinal.      SPECIMENS:  None.      COMPLICATION:  None.      DRAINS:  None.  EBL: <100 cc      TOURNIQUET TIME:   Total Tourniquet Time Documented: Thigh (Right) - 31 minutes Total: Thigh (Right) - 31 minutes  .      The patient was stable to the recovery room.      INDICATION FOR PROCEDURE:  Tracey Rosales is a 69 y.o. female patient of   mine.  The patient had been seen, evaluated, and treated for months conservatively in the   office with medication, activity modification, and injections.  The patient had   radiographic changes of bone-on-bone arthritis with endplate sclerosis and osteophytes noted.  Based on the radiographic changes and failed conservative measures, the patient   decided to proceed with definitive treatment, total knee replacement.  Risks of infection, DVT, component  failure, need for revision surgery, neurovascular injury were reviewed in the office setting.  The postop course was reviewed stressing the efforts to maximize post-operative satisfaction and function.  Consent was obtained for benefit of pain   relief.      PROCEDURE IN DETAIL:  The patient was brought to the operative theater.   Once adequate anesthesia, preoperative antibiotics, 2 gm of Ancef,1 gm of Tranexamic Acid, and 10 mg of Decadron administered, the patient was positioned supine with a right thigh tourniquet placed.  The  right lower extremity was prepped and draped in sterile fashion.  A time-   out was performed identifying the patient, planned procedure, and the appropriate extremity.      The right lower extremity was placed in the [facility] leg holder.  The leg was   exsanguinated, tourniquet elevated to 250 mmHg.  A midline incision was  made followed by median parapatellar arthrotomy.  Following initial   exposure, attention was first directed to the patella.  Precut   measurement was noted to be 20 mm with significant lateral facet wear.  I resected down to 13-14 mm and used a   35 anatomic patellar button to restore patellar height as well as cover the cut surface.      The lug holes were drilled and a metal shim was placed to protect the   patella from retractors and saw blade during the procedure.      At this point, attention was now directed to the femur.  The femoral   canal was opened with a drill, irrigated to try to prevent fat emboli.  An   intramedullary rod was passed at 3 degrees valgus, 9 mm of bone was   resected off the distal femur.  Following this resection, the tibia was   subluxated anteriorly.  Using the extramedullary guide, 2 mm of bone was resected off   the proximal medial tibia.  We confirmed the gap would be   stable medially and laterally with a size 6 spacer block as well as confirmed that the tibial cut was perpendicular in the coronal plane,  checking with an alignment rod.      Once this was done, I sized the femur to be a size 5 in the anterior-   posterior dimension, chose a narrow component based on medial and   lateral dimension.  The size 5 rotation block was then pinned in   position anterior referenced using the C-clamp to set rotation.  The   anterior, posterior, and  chamfer cuts were made without difficulty nor   notching making certain that I was along the anterior cortex to help   with flexion gap stability.      The final box cut was made off the lateral aspect of distal femur.      At this point, the tibia was sized to be a size 5.  The size 5 tray was   then pinned in position through the medial third of the tubercle,   drilled, and keel punched.  Trial reduction was now carried with a 5 femur,  5 tibia, a size 8 mm PS insert, and the 35 anatomic patella botton.  The knee was brought to full extension with good flexion stability with the patella   tracking through the trochlea without application of pressure.  Given   all these findings the trial components removed.  Final components were   opened and cement was mixed.  The knee was irrigated with normal saline solution and pulse lavage.  The synovial lining was   then injected with 30 cc of 0.25% Marcaine with epinephrine, 1 cc of Toradol and 30 cc of NS for a total of 61 cc.     Final implants were then cemented onto cleaned and dried cut surfaces of bone with the knee brought to extension with a size 8 mm PS trial insert.      Once the cement had fully cured, excess cement was removed   throughout the knee.  I confirmed that I was satisfied with the range of   motion and stability, and the final size 8 mm PS AOX insert was chosen.  It was   placed into the knee.      The tourniquet had been let down at 30 minutes.  No significant   hemostasis was required.  The extensor mechanism was  then reapproximated using #1 Vicryl and #1 Stratafix sutures with the knee    in flexion.  The   remaining wound was closed with 2-0 Vicryl and running 4-0 Monocryl.   The knee was cleaned, dried, dressed sterilely using Dermabond and   Aquacel dressing.  The patient was then   brought to recovery room in stable condition, tolerating the procedure   well.   Please note that Physician Assistant, Dennie Bible, PA-C was present for the entirety of the case, and was utilized for pre-operative positioning, peri-operative retractor management, general facilitation of the procedure and for primary wound closure at the end of the case.              Madlyn Frankel Charlann Boxer, M.D.    09/13/2019 11:36 AM

## 2019-09-13 NOTE — Anesthesia Postprocedure Evaluation (Signed)
Anesthesia Post Note  Patient: Tracey Rosales  Procedure(s) Performed: TOTAL KNEE ARTHROPLASTY (Right Knee)     Patient location during evaluation: PACU Anesthesia Type: Spinal Level of consciousness: oriented and awake and alert Pain management: pain level controlled Vital Signs Assessment: post-procedure vital signs reviewed and stable Respiratory status: spontaneous breathing, respiratory function stable and patient connected to nasal cannula oxygen Cardiovascular status: blood pressure returned to baseline and stable Postop Assessment: no headache, no backache and no apparent nausea or vomiting Anesthetic complications: no   No complications documented.  Last Vitals:  Vitals:   09/13/19 1345 09/13/19 1400  BP: 133/72 126/70  Pulse: 77 89  Resp: 14 13  Temp:  36.7 C  SpO2: 100% 100%    Last Pain:  Vitals:   09/13/19 1400  TempSrc:   PainSc: 0-No pain    LLE Motor Response: Purposeful movement (09/13/19 1400) LLE Sensation: Decreased;Numbness (09/13/19 1400) RLE Motor Response: Purposeful movement (09/13/19 1400) RLE Sensation: Decreased;Numbness (09/13/19 1400) L Sensory Level: S1-Sole of foot, small toes (09/13/19 1400) R Sensory Level: S1-Sole of foot, small toes (09/13/19 1400)  Hermes Wafer S

## 2019-09-13 NOTE — Evaluation (Signed)
Physical Therapy Evaluation Patient Details Name: Tracey Rosales MRN: [id number] DOB: March 27, 1950 Today's Date: 09/13/2019   History of Present Illness  69 y.o. female admitted 09/13/19 for R TKA. PMH includes GERD, HTN, OA, PONV.  Clinical Impression  Pt is s/p TKA resulting in the deficits listed below (see PT Problem List). Pt ambulated 50' with RW, distance limited by pt feeling overheated and nauseous. BP 81/49 sitting, HR 54, SaO2 97% on room air, RN notified. Good progress expected. Initiated TKA HEP.  Pt will benefit from skilled PT to increase their independence and safety with mobility to allow discharge to the venue listed below.      Follow Up Recommendations Follow surgeons recommendation for DC plan and follow-up therapies    Equipment Recommendations  Rolling walker with 5" wheels;3in1 (PT)    Recommendations for Other Services       Precautions / Restrictions Precautions Precautions: Knee Precaution Comments: reviewed no pillow under knee Restrictions Weight Bearing Restrictions: No      Mobility  Bed Mobility Overal bed mobility: Needs Assistance Bed Mobility: Supine to Sit     Supine to sit: Min assist     General bed mobility comments: assist to raise trunk and support RLE  Transfers Overall transfer level: Needs assistance Equipment used: Rolling walker (2 wheeled) Transfers: Sit to/from Stand Sit to Stand: Min assist         General transfer comment: VCs hand placement, min A to power up  Ambulation/Gait Ambulation/Gait assistance: Min guard Gait Distance (Feet): 50 Feet Assistive device: Rolling walker (2 wheeled) Gait Pattern/deviations: Step-to pattern;Decreased step length - right;Decreased step length - left Gait velocity: decr   General Gait Details: distance limited by feeling overheated and nauseous. Assisted pt to recliner, BP 81/49 sitting, HR 54, SaO2 97% on room air, RN notified of vitals, VCs sequencing  Stairs             Wheelchair Mobility    Modified Rankin (Stroke Patients Only)       Balance Overall balance assessment: Modified Independent                                           Pertinent Vitals/Pain Pain Assessment: 0-10 Pain Score: 4  Pain Location: R knee Pain Descriptors / Indicators: Sore Pain Intervention(s): Limited activity within patient's tolerance;Monitored during session;Premedicated before session;Ice applied    Home Living Family/patient expects to be discharged to:: Private residence Living Arrangements: Spouse/significant other Available Help at Discharge: Family;Available 24 hours/day Type of Home: House Home Access: Stairs to enter Entrance Stairs-Rails: Left Entrance Stairs-Number of Steps: 3 Home Layout: One level Home Equipment: None      Prior Function Level of Independence: Independent               Hand Dominance        Extremity/Trunk Assessment   Upper Extremity Assessment Upper Extremity Assessment: Overall WFL for tasks assessed    Lower Extremity Assessment Lower Extremity Assessment: RLE deficits/detail RLE Deficits / Details: SLR +2/5, knee AAROM ~ -5-50* limited by pain RLE Sensation: WNL RLE Coordination: WNL    Cervical / Trunk Assessment Cervical / Trunk Assessment: Normal  Communication   Communication: No difficulties  Cognition Arousal/Alertness: Awake/alert Behavior During Therapy: WFL for tasks assessed/performed Overall Cognitive Status: Within Functional Limits for tasks assessed  General Comments      Exercises Total Joint Exercises Ankle Circles/Pumps: AROM;Both;10 reps;Supine Heel Slides: AAROM;Right;5 reps;Supine Long Arc Quad: AROM;Right;5 reps;Seated Goniometric ROM: 5-50* AAROM R knee   Assessment/Plan    PT Assessment Patient needs continued PT services  PT Problem List Decreased strength;Decreased range of  motion;Decreased activity tolerance;Decreased mobility;Pain       PT Treatment Interventions Therapeutic activities;Therapeutic exercise;Gait training;Stair training;Functional mobility training;Patient/family education    PT Goals (Current goals can be found in the Care Plan section)  Acute Rehab PT Goals Patient Stated Goal: play with her 22 one year old grandkids PT Goal Formulation: With patient Time For Goal Achievement: 09/20/19 Potential to Achieve Goals: Good    Frequency 7X/week   Barriers to discharge        Co-evaluation               AM-PAC PT "6 Clicks" Mobility  Outcome Measure Help needed turning from your back to your side while in a flat bed without using bedrails?: A Little Help needed moving from lying on your back to sitting on the side of a flat bed without using bedrails?: A Little Help needed moving to and from a bed to a chair (including a wheelchair)?: A Little Help needed standing up from a chair using your arms (e.g., wheelchair or bedside chair)?: A Little Help needed to walk in hospital room?: A Little Help needed climbing 3-5 steps with a railing? : A Lot 6 Click Score: 17    End of Session Equipment Utilized During Treatment: Gait belt Activity Tolerance: Treatment limited secondary to medical complications (Comment) (hypotension, bradycardia) Patient left: in chair;with call bell/phone within reach;with family/visitor present;with chair alarm set Nurse Communication: Mobility status PT Visit Diagnosis: Difficulty in walking, not elsewhere classified (R26.2);Muscle weakness (generalized) (M62.81);Pain Pain - Right/Left: Right Pain - part of body: Knee    Time: 1651-1711 PT Time Calculation (min) (ACUTE ONLY): 20 min   Charges:   PT Evaluation $PT Eval Low Complexity: 1 Low        Ralene Bathe Kistler PT 09/13/2019  Acute Rehabilitation Services Pager [phone] Office [phone]

## 2019-09-13 NOTE — Transfer of Care (Signed)
Immediate Anesthesia Transfer of Care Note  Patient: Tracey Rosales  Procedure(s) Performed: TOTAL KNEE ARTHROPLASTY (Right Knee)  Patient Location: PACU  Anesthesia Type:MAC and Spinal  Level of Consciousness: awake, alert , oriented and patient cooperative  Airway & Oxygen Therapy: Patient Spontanous Breathing and Patient connected to face mask oxygen  Post-op Assessment: Report given to RN and Post -op Vital signs reviewed and stable  Post vital signs: Reviewed and stable  Last Vitals:  Vitals Value Taken Time  BP 100/59 09/13/19 1200  Temp    Pulse 99 09/13/19 1200  Resp 19 09/13/19 1200  SpO2 100 % 09/13/19 1200  Vitals shown include unvalidated device data.  Last Pain:  Vitals:   09/13/19 0956  TempSrc:   PainSc: 0-No pain      Patients Stated Pain Goal: 5 (08/03/74 2831)  Complications: No complications documented.

## 2019-09-13 NOTE — Interval H&P Note (Signed)
History and Physical Interval Note:  09/13/2019 8:58 AM  Georges Lynch Larrivee  has presented today for surgery, with the diagnosis of Right knee osteoarthritis.  The various methods of treatment have been discussed with the patient and family. After consideration of risks, benefits and other options for treatment, the patient has consented to  Procedure(s) with comments: TOTAL KNEE ARTHROPLASTY (Right) - 70 mins as a surgical intervention.  The patient's history has been reviewed, patient examined, no change in status, stable for surgery.  I have reviewed the patient's chart and labs.  Questions were answered to the patient's satisfaction.     Shelda Pal

## 2019-09-13 NOTE — Progress Notes (Signed)
Assisted Dr. Rose with right, ultrasound guided, adductor canal block. Side rails up, monitors on throughout procedure. See vital signs in flow sheet. Tolerated Procedure well.  

## 2019-09-13 NOTE — Discharge Instructions (Signed)

## 2019-09-13 NOTE — Plan of Care (Signed)
  Problem: Safety: Goal: Ability to remain free from injury will improve Outcome: Progressing   Problem: Pain Managment: Goal: General experience of comfort will improve Outcome: Progressing   Problem: Elimination: Goal: Will not experience complications related to bowel motility Outcome: Progressing   Problem: Skin Integrity: Goal: Risk for impaired skin integrity will decrease Outcome: Progressing

## 2019-09-13 NOTE — Anesthesia Procedure Notes (Signed)
Anesthesia Regional Block: Adductor canal block   Pre-Anesthetic Checklist: ,, timeout performed, Correct Patient, Correct Site, Correct Laterality, Correct Procedure, Correct Position, site marked, Risks and benefits discussed,  Surgical consent,  Pre-op evaluation,  At surgeon's request and post-op pain management  Laterality: Right  Prep: chloraprep       Needles:  Injection technique: Single-shot  Needle Type: Echogenic Needle     Needle Length: 9cm      Additional Needles:   Procedures:,,,, ultrasound used (permanent image in chart),,,,  Narrative:  Start time: 09/13/2019 9:48 AM End time: 09/13/2019 9:55 AM Injection made incrementally with aspirations every 5 mL.  Performed by: Personally  Anesthesiologist: Eilene Ghazi, MD  Additional Notes: Patient tolerated the procedure well without complications

## 2019-09-13 NOTE — Anesthesia Procedure Notes (Signed)
Spinal  Patient location during procedure: OR Start time: 09/13/2019 10:33 AM End time: 09/13/2019 10:33 AM Staffing Performed: resident/CRNA  Anesthesiologist: Eilene Ghazi, MD Resident/CRNA: Epimenio Sarin, CRNA Preanesthetic Checklist Completed: patient identified, IV checked, risks and benefits discussed, surgical consent, monitors and equipment checked, pre-op evaluation and timeout performed Spinal Block Patient position: sitting Prep: DuraPrep Patient monitoring: heart rate, cardiac monitor, continuous pulse ox and blood pressure Approach: midline Location: L3-4 Injection technique: single-shot Needle Needle type: Pencan  Needle gauge: 24 G Needle length: 10 cm Needle insertion depth: 7 cm Assessment Sensory level: T6

## 2019-09-13 NOTE — Anesthesia Preprocedure Evaluation (Signed)
Anesthesia Evaluation  Patient identified by MRN, date of birth, ID band Patient awake    Reviewed: Allergy & Precautions, NPO status , Patient's Chart, lab work & pertinent test results  History of Anesthesia Complications (+) PONV  Airway Mallampati: II  TM Distance: <3 FB Neck ROM: Full    Dental no notable dental hx.    Pulmonary neg pulmonary ROS,    Pulmonary exam normal breath sounds clear to auscultation       Cardiovascular hypertension, Normal cardiovascular exam Rhythm:Regular Rate:Normal     Neuro/Psych negative neurological ROS  negative psych ROS   GI/Hepatic negative GI ROS, Neg liver ROS,   Endo/Other  negative endocrine ROS  Renal/GU negative Renal ROS  negative genitourinary   Musculoskeletal negative musculoskeletal ROS (+)   Abdominal   Peds negative pediatric ROS (+)  Hematology negative hematology ROS (+)   Anesthesia Other Findings   Reproductive/Obstetrics negative OB ROS                             Anesthesia Physical Anesthesia Plan  ASA: II  Anesthesia Plan: Spinal   Post-op Pain Management:  Regional for Post-op pain   Induction: Intravenous  PONV Risk Score and Plan: 3 and Ondansetron, Dexamethasone and Treatment may vary due to age or medical condition  Airway Management Planned: Simple Face Mask  Additional Equipment:   Intra-op Plan:   Post-operative Plan:   Informed Consent: I have reviewed the patients History and Physical, chart, labs and discussed the procedure including the risks, benefits and alternatives for the proposed anesthesia with the patient or authorized representative who has indicated his/her understanding and acceptance.     Dental advisory given  Plan Discussed with: CRNA and Surgeon  Anesthesia Plan Comments:         Anesthesia Quick Evaluation

## 2019-09-14 ENCOUNTER — Encounter (HOSPITAL_COMMUNITY): Payer: Self-pay | Admitting: Orthopedic Surgery

## 2019-09-14 DIAGNOSIS — M1711 Unilateral primary osteoarthritis, right knee: Secondary | ICD-10-CM | POA: Diagnosis not present

## 2019-09-14 LAB — BASIC METABOLIC PANEL
Anion gap: 8 (ref 5–15)
BUN: 13 mg/dL (ref 8–23)
CO2: 25 mmol/L (ref 22–32)
Calcium: 9 mg/dL (ref 8.9–10.3)
Chloride: 106 mmol/L (ref 98–111)
Creatinine, Ser: 0.57 mg/dL (ref 0.44–1.00)
GFR calc Af Amer: >60 mL/min (ref >60)
GFR calc non Af Amer: >60 mL/min (ref >60)
Glucose, Bld: 135 mg/dL — ABNORMAL HIGH (ref 70–99)
Potassium: 3.7 mmol/L (ref 3.5–5.1)
Sodium: 139 mmol/L (ref 135–145)

## 2019-09-14 LAB — CBC
HCT: 34.2 % — ABNORMAL LOW (ref 36.0–46.0)
Hemoglobin: 10.9 g/dL — ABNORMAL LOW (ref 12.0–15.0)
MCH: 28.2 pg (ref 26.0–34.0)
MCHC: 31.9 g/dL (ref 30.0–36.0)
MCV: 88.4 fL (ref 80.0–100.0)
Platelets: 265 10*3/uL (ref 150–400)
RBC: 3.87 MIL/uL (ref 3.87–5.11)
RDW: 13.6 % (ref 11.5–15.5)
WBC: 15.6 10*3/uL — ABNORMAL HIGH (ref 4.0–10.5)
nRBC: 0 % (ref 0.0–0.2)

## 2019-09-14 MED ORDER — FERROUS SULFATE 325 (65 FE) MG PO TABS: 325.0000 mg | ORAL_TABLET | Freq: Two times a day (BID) | ORAL | 0 refills | Status: DC

## 2019-09-14 MED ORDER — TRAMADOL HCL 50 MG PO TABS: 50.0000 mg | ORAL_TABLET | Freq: Four times a day (QID) | ORAL | 0 refills | Status: DC | PRN

## 2019-09-14 MED ORDER — ASPIRIN 81 MG PO CHEW: 81.0000 mg | CHEWABLE_TABLET | Freq: Two times a day (BID) | ORAL | 0 refills | Status: AC

## 2019-09-14 MED ORDER — HYDROCODONE-ACETAMINOPHEN 5-325 MG PO TABS: 1.0000 | ORAL_TABLET | ORAL | 0 refills | Status: DC | PRN

## 2019-09-14 MED ORDER — METHOCARBAMOL 500 MG PO TABS: 500.0000 mg | ORAL_TABLET | Freq: Four times a day (QID) | ORAL | 0 refills | Status: DC | PRN

## 2019-09-14 MED ORDER — ONDANSETRON HCL 4 MG PO TABS: 4.0000 mg | ORAL_TABLET | Freq: Four times a day (QID) | ORAL | 0 refills | Status: DC | PRN

## 2019-09-14 NOTE — Progress Notes (Signed)
Physical Therapy Treatment Patient Details Name: Tracey Rosales MRN: [id number] DOB: 05/03/1950 Today's Date: 09/14/2019    History of Present Illness 69 y.o. female admitted 09/13/19 for R TKA. PMH includes GERD, HTN, OA, PONV.    PT Comments    Pt reports she's feeling much better, nausea has resolved. Stair training completed. Also completed instruction in HEP. Pt is ready to DC home from PT standpoint.  Follow Up Recommendations  Follow surgeons recommendation for DC plan and follow-up therapies;Outpatient PT     Equipment Recommendations  Rolling walker with 5" wheels;3in1 (PT)    Recommendations for Other Services       Precautions / Restrictions Precautions Precautions: Knee Precaution Booklet Issued: Yes (comment) Precaution Comments: reviewed no pillow under knee Restrictions Weight Bearing Restrictions: No Other Position/Activity Restrictions: WBAT    Mobility  Bed Mobility Overal bed mobility: Modified Independent Bed Mobility: Sit to Supine     Supine to sit: Modified independent (Device/Increase time);HOB elevated Sit to supine: HOB elevated;Min guard   General bed mobility comments: instructed pt to self assist RLE with LLE  Transfers Overall transfer level: Needs assistance Equipment used: Rolling walker (2 wheeled) Transfers: Sit to/from Stand Sit to Stand: Supervision         General transfer comment: VCs hand placement  Ambulation/Gait Ambulation/Gait assistance: Supervision Gait Distance (Feet): 90 Feet Assistive device: Rolling walker (2 wheeled) Gait Pattern/deviations: Step-to pattern;Decreased step length - right;Decreased step length - left Gait velocity: decr   General Gait Details: VCs sequencing, no loss of balance, 3/10 R knee pain walking   Stairs Stairs: Yes Stairs assistance: Min guard Stair Management: One rail Left;Forwards;With cane;Step to pattern Number of Stairs: 3 General stair comments: VCs sequencing,  min/guard safety   Wheelchair Mobility    Modified Rankin (Stroke Patients Only)       Balance Overall balance assessment: Modified Independent                                          Cognition Arousal/Alertness: Awake/alert Behavior During Therapy: WFL for tasks assessed/performed Overall Cognitive Status: Within Functional Limits for tasks assessed                                        Exercises Total Joint Exercises Ankle Circles/Pumps: AROM;Both;10 reps;Supine Quad Sets: AROM;Both;5 reps;Supine Short Arc Quad: AAROM;Right;5 reps;Supine Heel Slides: AAROM;Right;5 reps;Supine Straight Leg Raises: AROM;Right;10 reps;Seated Knee Flexion: AAROM;Right;10 reps;Seated;AROM Goniometric ROM: 5-75* AAROM R knee    General Comments        Pertinent Vitals/Pain Pain Score: 3  Pain Location: R knee Pain Descriptors / Indicators: Sore Pain Intervention(s): Limited activity within patient's tolerance;Monitored during session;Premedicated before session;Ice applied    Home Living                      Prior Function            PT Goals (current goals can now be found in the care plan section) Acute Rehab PT Goals Patient Stated Goal: play with her 17 one year old grandkids PT Goal Formulation: With patient Time For Goal Achievement: 09/20/19 Potential to Achieve Goals: Good Progress towards PT goals: Progressing toward goals    Frequency    7X/week      PT  Plan Current plan remains appropriate    Co-evaluation              AM-PAC PT "6 Clicks" Mobility   Outcome Measure  Help needed turning from your back to your side while in a flat bed without using bedrails?: A Little Help needed moving from lying on your back to sitting on the side of a flat bed without using bedrails?: A Little Help needed moving to and from a bed to a chair (including a wheelchair)?: None Help needed standing up from a chair using your  arms (e.g., wheelchair or bedside chair)?: None Help needed to walk in hospital room?: None Help needed climbing 3-5 steps with a railing? : A Little 6 Click Score: 21    End of Session Equipment Utilized During Treatment: Gait belt Activity Tolerance: Patient tolerated treatment well Patient left: with call bell/phone within reach;with family/visitor present;in bed Nurse Communication: Mobility status PT Visit Diagnosis: Difficulty in walking, not elsewhere classified (R26.2);Muscle weakness (generalized) (M62.81);Pain Pain - Right/Left: Right Pain - part of body: Knee     Time: 7116-5790 PT Time Calculation (min) (ACUTE ONLY): 34 min  Charges:  $Gait Training: 8-22 mins $Therapeutic Exercise: 8-22 mins                     Ralene Bathe Kistler PT 09/14/2019  Acute Rehabilitation Services Pager [phone] Office [phone]

## 2019-09-14 NOTE — TOC Transition Note (Signed)
Transition of Care [facility]) - CM/SW Discharge Note   Patient Details  Name: NYIA TSAO MRN: [id number] Date of Birth: 1950/05/07  Transition of Care [facility]) CM/SW Contact:  Clearance Coots, LCSW Phone Number: 09/14/2019, 10:18 AM   Clinical Narrative:    Therapy Plan: OPPT-EO [facility]  RW and 3 in1 delivered to the patient bedside by [employer]  Final next level of care: OP Rehab Barriers to Discharge: No Barriers Identified   Patient Goals and CMS Choice     Choice offered to / list presented to : NA  Discharge Placement                       Discharge Plan and Services                DME Arranged: 3-N-1, Walker rolling DME Agency: [employer] Date DME Agency Contacted: 09/14/19 Time DME Agency Contacted: 0900 Representative spoke with at DME Agency: Harrold Donath            Social Determinants of Health (SDOH) Interventions     Readmission Risk Interventions No flowsheet data found.

## 2019-09-14 NOTE — Progress Notes (Signed)
Physical Therapy Treatment Patient Details Name: Tracey Rosales MRN: [id number] DOB: Jan 21, 1951 Today's Date: 09/14/2019    History of Present Illness 69 y.o. female admitted 09/13/19 for R TKA. PMH includes GERD, HTN, OA, PONV.    PT Comments    Pt reported nausea earlier this morning, pain medication has been changed and pt reports improvement, but not complete relief, of nausea. Improved activity tolerance today. Pt ambulated 110' with RW, no loss of balance. Instructed pt in HEP. Will plan on second session for stair training this afternoon.      Follow Up Recommendations  Follow surgeon's recommendation for DC plan and follow-up therapies;Outpatient PT     Equipment Recommendations  Rolling walker with 5" wheels;3in1 (PT)    Recommendations for Other Services       Precautions / Restrictions Precautions Precautions: Knee Precaution Booklet Issued: Yes (comment) Precaution Comments: reviewed no pillow under knee Restrictions Weight Bearing Restrictions: No Other Position/Activity Restrictions: WBAT    Mobility  Bed Mobility Overal bed mobility: Modified Independent Bed Mobility: Supine to Sit     Supine to sit: Modified independent (Device/Increase time);HOB elevated     General bed mobility comments: used rail, HOB up  Transfers Overall transfer level: Needs assistance Equipment used: Rolling walker (2 wheeled) Transfers: Sit to/from Stand Sit to Stand: Min guard         General transfer comment: VCs hand placement, min guard safety  Ambulation/Gait Ambulation/Gait assistance: Min guard Gait Distance (Feet): 110 Feet Assistive device: Rolling walker (2 wheeled) Gait Pattern/deviations: Step-to pattern;Decreased step length - right;Decreased step length - left Gait velocity: decr   General Gait Details: VCs sequencing, no loss of balance, 4/10 R knee pain walking   Stairs             Wheelchair Mobility    Modified Rankin (Stroke  Patients Only)       Balance Overall balance assessment: Modified Independent                                          Cognition Arousal/Alertness: Awake/alert Behavior During Therapy: WFL for tasks assessed/performed Overall Cognitive Status: Within Functional Limits for tasks assessed                                        Exercises Total Joint Exercises Ankle Circles/Pumps: AROM;Both;10 reps;Supine Quad Sets: AROM;Both;5 reps;Supine Short Arc Quad: AAROM;Right;5 reps;Supine Heel Slides: AAROM;Right;5 reps;Supine Straight Leg Raises: AAROM;Right;5 reps;Supine Goniometric ROM: 5-50* AAROM R knee    General Comments        Pertinent Vitals/Pain Pain Score: 4  Pain Location: R knee Pain Descriptors / Indicators: Sore Pain Intervention(s): Limited activity within patient's tolerance;Monitored during session;Premedicated before session;Ice applied    Home Living                      Prior Function            PT Goals (current goals can now be found in the care plan section) Acute Rehab PT Goals Patient Stated Goal: play with her 72 one year old grandkids PT Goal Formulation: With patient Time For Goal Achievement: 09/20/19 Potential to Achieve Goals: Good Progress towards PT goals: Progressing toward goals    Frequency    7X/week  PT Plan Current plan remains appropriate    Co-evaluation              AM-PAC PT "6 Clicks" Mobility   Outcome Measure  Help needed turning from your back to your side while in a flat bed without using bedrails?: A Little Help needed moving from lying on your back to sitting on the side of a flat bed without using bedrails?: A Little Help needed moving to and from a bed to a chair (including a wheelchair)?: A Little Help needed standing up from a chair using your arms (e.g., wheelchair or bedside chair)?: A Little Help needed to walk in hospital room?: A Little Help needed  climbing 3-5 steps with a railing? : A Little 6 Click Score: 18    End of Session Equipment Utilized During Treatment: Gait belt Activity Tolerance: Patient tolerated treatment well Patient left: in chair;with call bell/phone within reach;with family/visitor present;with chair alarm set Nurse Communication: Mobility status PT Visit Diagnosis: Difficulty in walking, not elsewhere classified (R26.2);Muscle weakness (generalized) (M62.81);Pain Pain - Right/Left: Right Pain - part of body: Knee     Time: 0814-4818 PT Time Calculation (min) (ACUTE ONLY): 33 min  Charges:  $Gait Training: 8-22 mins $Therapeutic Exercise: 8-22 mins                     Ralene Bathe Kistler PT 09/14/2019  Acute Rehabilitation Services Pager [phone] Office [phone]

## 2019-09-14 NOTE — Progress Notes (Signed)
° °  Subjective: 1 Day Post-Op Procedure(s) (LRB): TOTAL KNEE ARTHROPLASTY (Right) Patient reports pain as mild.   Patient seen in rounds with Dr. Charlann Boxer. Patient is well, and has had no acute complaints or problems other than discomfort in the right knee. No acute events overnight. Ambulated 50 feet with PT yesterday.  We will continue therapy today.   Objective: Vital signs in last 24 hours: Temp:  [97.7 F (36.5 C)-98.5 F (36.9 C)] 97.7 F (36.5 C) (08/04 0540) Pulse Rate:  [54-102] 79 (08/04 0540) Resp:  [11-19] 17 (08/04 0540) BP: (81-147)/(49-87) 123/73 (08/04 0540) SpO2:  [95 %-100 %] 99 % (08/04 0540)  Intake/Output from previous day:  Intake/Output Summary (Last 24 hours) at 09/14/2019 0823 Last data filed at 09/14/2019 0600 Gross per 24 hour  Intake 4128.12 ml  Output 3100 ml  Net 1028.12 ml     Intake/Output this shift: No intake/output data recorded.  Labs: Recent Labs    09/14/19 0244  HGB 10.9*   Recent Labs    09/14/19 0244  WBC 15.6*  RBC 3.87  HCT 34.2*  PLT 265   Recent Labs    09/14/19 0244  NA 139  K 3.7  CL 106  CO2 25  BUN 13  CREATININE 0.57  GLUCOSE 135*  CALCIUM 9.0   No results for input(s): LABPT, INR in the last 72 hours.  Exam: General - Patient is Alert and Oriented Extremity - Neurologically intact Sensation intact distally Intact pulses distally Dorsiflexion/Plantar flexion intact Dressing - dressing C/D/I Motor Function - intact, moving foot and toes well on exam.   Past Medical History:  Diagnosis Date   Arthritis    Hands, knees,   GERD (gastroesophageal reflux disease)    Heart murmur 1979   Hypertension    Hypochloremia    PONV (postoperative nausea and vomiting)     Assessment/Plan: 1 Day Post-Op Procedure(s) (LRB): TOTAL KNEE ARTHROPLASTY (Right) Active Problems:   S/P right TKA   S/P total knee arthroplasty  Estimated body mass index is 32.2 kg/m as calculated from the following:   Height  as of this encounter: 5\' 6"  (1.676 m).   Weight as of this encounter: 90.5 kg. Advance diet Up with therapy D/C IV fluids   Patient's anticipated LOS is less than 2 midnights, meeting these requirements: - Younger than 64 - Lives within 1 hour of care - Has a competent adult at home to recover with post-op recover - NO history of  - Chronic pain requiring opiods  - Diabetes  - Coronary Artery Disease  - Heart failure  - Heart attack  - Stroke  - DVT/VTE  - Cardiac arrhythmia  - Respiratory Failure/COPD  - Renal failure  - Anemia  - Advanced Liver disease   DVT Prophylaxis - Aspirin Weight bearing as tolerated.  Plan is to go Home after hospital stay. Plan for likely discharge today following 1-2 sessions of therapy as long as she is meeting her goals. We will send her with Zofran for nausea. Follow up in the office in 2 weeks.   74, PA-C Orthopedic Surgery [phone] 09/14/2019, 8:23 AM

## 2019-09-20 NOTE — Discharge Summary (Signed)
Physician Discharge Summary   Patient ID: Tracey Rosales MRN: [id number] DOB/AGE: 71-Feb-1952 71 y.o.  Admit date: 09/13/2019 Discharge date: 09/14/2019  Primary Diagnosis: Right knee osteoarthritis  Admission Diagnoses:  Past Medical History:  Diagnosis Date  . Arthritis    Hands, knees,  . GERD (gastroesophageal reflux disease)   . Heart murmur 1979  . Hypertension   . Hypochloremia   . PONV (postoperative nausea and vomiting)    Discharge Diagnoses:   Active Problems:   S/P right TKA   S/P total knee arthroplasty  Estimated body mass index is 32.2 kg/m as calculated from the following:   Height as of this encounter: '5\' 6"'$  (1.676 m).   Weight as of this encounter: 90.5 kg.  Procedure:  Procedure(s) (LRB): TOTAL KNEE ARTHROPLASTY (Right)   Consults: None  HPI: Tracey Rosales is a 69 y.o. female patient of   mine.  The patient had been seen, evaluated, and treated for months conservatively in the   office with medication, activity modification, and injections.  The patient had   radiographic changes of bone-on-bone arthritis with endplate sclerosis and osteophytes noted.  Based on the radiographic changes and failed conservative measures, the patient   decided to proceed with definitive treatment, total knee replacement.  Risks of infection, DVT, component failure, need for revision surgery, neurovascular injury were reviewed in the office setting.  The postop course was reviewed stressing the efforts to maximize post-operative satisfaction and function.  Consent was obtained for benefit of pain   relief.   Laboratory Data: Admission on 09/13/2019, Discharged on 09/14/2019  Component Date Value Ref Range Status  . ABO/RH(D) 09/13/2019    Final                   Value:A POS Performed at [facility], [place] [address] [place], [place] [address]   . WBC 09/14/2019 15.6* 4.0 - 10.5 K/uL Final  . RBC 09/14/2019 3.87  3.87 - 5.11 MIL/uL Final  .  Hemoglobin 09/14/2019 10.9* 12.0 - 15.0 g/dL Final  . HCT 09/14/2019 34.2* 36 - 46 % Final  . MCV 09/14/2019 88.4  80.0 - 100.0 fL Final  . MCH 09/14/2019 28.2  26.0 - 34.0 pg Final  . MCHC 09/14/2019 31.9  30.0 - 36.0 g/dL Final  . RDW 09/14/2019 13.6  11.5 - 15.5 % Final  . Platelets 09/14/2019 265  150 - 400 K/uL Final  . nRBC 09/14/2019 0.0  0.0 - 0.2 % Final   Performed at [facility], [place] [address] [place], [place] [address]  . Sodium 09/14/2019 139  135 - 145 mmol/L Final  . Potassium 09/14/2019 3.7  3.5 - 5.1 mmol/L Final  . Chloride 09/14/2019 106  98 - 111 mmol/L Final  . CO2 09/14/2019 25  22 - 32 mmol/L Final  . Glucose, Bld 09/14/2019 135* 70 - 99 mg/dL Final   Glucose reference range applies only to samples taken after fasting for at least 8 hours.  . BUN 09/14/2019 13  8 - 23 mg/dL Final  . Creatinine, Ser 09/14/2019 0.57  0.44 - 1.00 mg/dL Final  . Calcium 09/14/2019 9.0  8.9 - 10.3 mg/dL Final  . GFR calc non Af Amer 09/14/2019 >60  >60 mL/min Final  . GFR calc Af Amer 09/14/2019 >60  >60 mL/min Final  . Anion gap 09/14/2019 8  5 - 15 Final   Performed at [facility], [address] [place],  [place] [address]  Hospital Outpatient Visit on 09/09/2019  Component Date Value Ref Range Status  . SARS Coronavirus 2 09/09/2019 NEGATIVE  NEGATIVE Final   Comment: (NOTE) SARS-CoV-2 target nucleic acids are NOT DETECTED.  The SARS-CoV-2 RNA is generally detectable in upper and lower respiratory specimens during the acute phase of infection. Negative results do not preclude SARS-CoV-2 infection, do not rule out co-infections with other pathogens, and should not be used as the sole basis for treatment or other patient management decisions. Negative results must be combined with clinical observations, patient history, and epidemiological information. The expected result is Negative.  Fact Sheet for  Patients: HairSlick.no  Fact Sheet for Healthcare Providers: quierodirigir.com  This test is not yet approved or cleared by the [place] FDA and  has been authorized for detection and/or diagnosis of SARS-CoV-2 by FDA under an Emergency Use Authorization (EUA). This EUA will remain  in effect (meaning this test can be used) for the duration of the COVID-19 declaration under Se                          ction 564(b)(1) of the Act, 21 U.S.C. section 360bbb-3(b)(1), unless the authorization is terminated or revoked sooner.  Performed at [facility], [address] [place], [place] [address]   Hospital Outpatient Visit on 09/06/2019  Component Date Value Ref Range Status  . Sodium 09/06/2019 142  135 - 145 mmol/L Final  . Potassium 09/06/2019 3.9  3.5 - 5.1 mmol/L Final  . Chloride 09/06/2019 103  98 - 111 mmol/L Final  . CO2 09/06/2019 28  22 - 32 mmol/L Final  . Glucose, Bld 09/06/2019 98  70 - 99 mg/dL Final   Glucose reference range applies only to samples taken after fasting for at least 8 hours.  . BUN 09/06/2019 13  8 - 23 mg/dL Final  . Creatinine, Ser 09/06/2019 0.52  0.44 - 1.00 mg/dL Final  . Calcium 12/69/9883 9.9  8.9 - 10.3 mg/dL Final  . GFR calc non Af Amer 09/06/2019 >60  >60 mL/min Final  . GFR calc Af Amer 09/06/2019 >60  >60 mL/min Final  . Anion gap 09/06/2019 11  5 - 15 Final   Performed at [facility], [address], [place], [place] [address]  . WBC 09/06/2019 9.0  4.0 - 10.5 K/uL Final  . RBC 09/06/2019 5.02  3.87 - 5.11 MIL/uL Final  . Hemoglobin 09/06/2019 14.1  12.0 - 15.0 g/dL Final  . HCT 17/58/1799 43.2  36 - 46 % Final  . MCV 09/06/2019 86.1  80.0 - 100.0 fL Final  . MCH 09/06/2019 28.1  26.0 - 34.0 pg Final  . MCHC 09/06/2019 32.6  30.0 - 36.0 g/dL Final  . RDW 79/70/6206 13.6  11.5 - 15.5 % Final  . Platelets 09/06/2019 328  150 - 400 K/uL Final  . nRBC  09/06/2019 0.0  0.0 - 0.2 % Final   Performed at [facility], [address] [place], [place] [address]  . ABO/RH(D) 09/06/2019 A POS   Final  . Antibody Screen 09/06/2019 NEG   Final  . Sample Expiration 09/06/2019 09/16/2019,2359   Final  . Extend sample reason 09/06/2019    Final                   Value:NO TRANSFUSIONS OR PREGNANCY IN THE PAST 3 MONTHS Performed at [facility], [address], [place], [place] [address]   . MRSA, PCR 09/06/2019 NEGATIVE  NEGATIVE Final  . Staphylococcus aureus 09/06/2019 NEGATIVE  NEGATIVE Final   Comment: (NOTE) The Xpert SA Assay (FDA approved for NASAL specimens in patients 37 years of age and older), is one component of a comprehensive surveillance program. It is not intended to diagnose infection nor to guide or monitor treatment. Performed at [facility], [place] [address] [place], [place] [address]   Hospital Outpatient Visit on 08/29/2019  Component Date Value Ref Range Status  . S' Lateral 08/29/2019 3.20  cm Final  . Area-P 1/2 08/29/2019 2.37  cm2 Final  Office Visit on 08/25/2019  Component Date Value Ref Range Status  . Glucose 08/25/2019 123* 65 - 99 mg/dL Final  . BUN 08/25/2019 18  8 - 27 mg/dL Final  . Creatinine, Ser 08/25/2019 0.66  0.57 - 1.00 mg/dL Final  . GFR calc non Af Amer 08/25/2019 90  >59 mL/min/1.73 Final  . GFR calc Af Amer 08/25/2019 104  >59 mL/min/1.73 Final   Comment: **[facility] currently reports eGFR in compliance with the current**   recommendations of the [employer]. [facility] will   update reporting as new guidelines are published from the NKF-ASN   Task force.   . BUN/Creatinine Ratio 08/25/2019 27  12 - 28 Final  . Sodium 08/25/2019 140  134 - 144 mmol/L Final  . Potassium 08/25/2019 4.3  3.5 - 5.2 mmol/L Final  . Chloride 08/25/2019 100  96 - 106 mmol/L Final  . CO2 08/25/2019 23  20 - 29 mmol/L Final  . Calcium 08/25/2019 9.9   8.7 - 10.3 mg/dL Final  . Magnesium 08/25/2019 2.2  1.6 - 2.3 mg/dL Final  . TSH 08/25/2019 1.520  0.450 - 4.500 uIU/mL Final     X-Rays:ECHOCARDIOGRAM COMPLETE  Result Date: 08/29/2019    ECHOCARDIOGRAM REPORT   Patient Name:   KILEA MCCAREY Date of Exam: 08/29/2019 Medical Rec #:  [id number]          Height:       66.5 in Accession #:    [id number]         Weight:       199.6 lb Date of Birth:  05/30/1950         BSA:          2.009 m Patient Age:    22 years           BP:           142/80 mmHg Patient Gender: F                  HR:           78 bpm. Exam Location:  Outpatient Procedure: 2D Echo Indications:    Murmur R01.1  History:        Patient has no prior history of Echocardiogram examinations.                 Risk Factors:Hypertension.  Sonographer:    Mikki Santee RDCS (AE) Referring Phys: [id number] [place]  1. Left ventricular ejection fraction, by estimation, is 65 to 70%. The left ventricle has normal function. The left ventricle has no regional wall motion abnormalities. There is mild left ventricular hypertrophy. Left ventricular diastolic parameters are indeterminate.  2. Right ventricular systolic function is normal. The right ventricular size is normal. Tricuspid regurgitation signal is inadequate for assessing PA pressure.  3. The mitral  valve is normal in structure. Trivial mitral valve regurgitation.  4. The aortic valve was not well visualized. Aortic valve regurgitation is not visualized. No aortic stenosis is present.  5. Aortic dilatation noted. There is mild dilatation of the ascending aorta measuring 38 mm. FINDINGS  Left Ventricle: Left ventricular ejection fraction, by estimation, is 65 to 70%. The left ventricle has normal function. The left ventricle has no regional wall motion abnormalities. The left ventricular internal cavity size was normal in size. There is  mild left ventricular hypertrophy. Left ventricular diastolic parameters are  indeterminate. Right Ventricle: The right ventricular size is normal. Right vetricular wall thickness was not assessed. Right ventricular systolic function is normal. Tricuspid regurgitation signal is inadequate for assessing PA pressure. Left Atrium: Left atrial size was normal in size. Right Atrium: Right atrial size was normal in size. Pericardium: Trivial pericardial effusion is present. Presence of pericardial fat pad. Mitral Valve: The mitral valve is normal in structure. Trivial mitral valve regurgitation. Tricuspid Valve: The tricuspid valve is grossly normal. Tricuspid valve regurgitation is trivial. Aortic Valve: The aortic valve was not well visualized. Aortic valve regurgitation is not visualized. No aortic stenosis is present. Pulmonic Valve: The pulmonic valve was not well visualized. Pulmonic valve regurgitation is not visualized. Aorta: The aortic root is normal in size and structure and aortic dilatation noted. There is mild dilatation of the ascending aorta measuring 38 mm. IAS/Shunts: The interatrial septum was not well visualized.  LEFT VENTRICLE PLAX 2D LVIDd:         4.60 cm  Diastology LVIDs:         3.20 cm  LV e' lateral:   8.49 cm/s LV PW:         1.00 cm  LV E/e' lateral: 7.3 LV IVS:        1.10 cm  LV e' medial:    6.31 cm/s LVOT diam:     2.20 cm  LV E/e' medial:  9.9 LV SV:         95 LV SV Index:   47 LVOT Area:     3.80 cm  RIGHT VENTRICLE RV S prime:     19.80 cm/s LEFT ATRIUM           Index       RIGHT ATRIUM           Index LA diam:      3.50 cm 1.74 cm/m  RA Area:     17.60 cm LA Vol (A2C): 34.8 ml 17.32 ml/m RA Volume:   45.60 ml  22.69 ml/m LA Vol (A4C): 24.5 ml 12.19 ml/m  AORTIC VALVE LVOT Vmax:   119.00 cm/s LVOT Vmean:  78.600 cm/s LVOT VTI:    0.249 m  AORTA Ao Root diam: 2.80 cm MITRAL VALVE MV Area (PHT): 2.37 cm    SHUNTS MV Decel Time: 320 msec    Systemic VTI:  0.25 m MV E velocity: 62.40 cm/s  Systemic Diam: 2.20 cm MV A velocity: 82.30 cm/s MV E/A ratio:   0.76 Epifanio Lesches MD Electronically signed by Epifanio Lesches MD Signature Date/Time: 08/29/2019/10:45:01 PM    Final     EKG: Orders placed or performed in visit on 08/25/19  . EKG 12-Lead     Hospital Course: AHMIYAH COIL is a 69 y.o. who was admitted to [facility]. They were brought to the operating room on 09/13/2019 and underwent Procedure(s): TOTAL KNEE ARTHROPLASTY.  Patient tolerated the procedure well and  was later transferred to the recovery room and then to the orthopaedic floor for postoperative care. They were given PO and IV analgesics for pain control following their surgery. They were given 24 hours of postoperative antibiotics of  Anti-infectives (From admission, onward)   Start     Dose/Rate Route Frequency Ordered Stop   09/13/19 1700  ceFAZolin (ANCEF) IVPB 2g/100 mL premix        2 g 200 mL/hr over 30 Minutes Intravenous Every 6 hours 09/13/19 1421 09/13/19 2236   09/13/19 0800  ceFAZolin (ANCEF) IVPB 2g/100 mL premix        2 g 200 mL/hr over 30 Minutes Intravenous On call to O.R. 09/13/19 2774 09/13/19 1027     and started on DVT prophylaxis in the form of Aspirin.   PT and OT were ordered for total joint protocol. Discharge planning consulted to help with postop disposition and equipment needs.  Patient had a good night on the evening of surgery. They started to get up OOB with therapy on POD #0, but was limited by nausea. Pt was seen during rounds on POD #1 and was ready to go home pending progress with therapy. She worked with therapy on POD #1 and was meeting her goals. Pt was discharged to home later that day in stable condition.  Diet: Regular diet Activity: WBAT Follow-up: in 2 weeks Disposition: Home Discharged Condition: good   Discharge Instructions    Call MD / Call 911   Complete by: As directed    If you experience chest pain or shortness of breath, CALL 911 and be transported to the hospital emergency room.  If you develope  a fever above 101 F, pus (white drainage) or increased drainage or redness at the wound, or calf pain, call your surgeon's office.   Change dressing   Complete by: As directed    Maintain surgical dressing until follow up in the clinic. If the edges start to pull up, may reinforce with tape. If the dressing is no longer working, may remove and cover with gauze and tape, but must keep the area dry and clean.  Call with any questions or concerns.   Constipation Prevention   Complete by: As directed    Drink plenty of fluids.  Prune juice may be helpful.  You may use a stool softener, such as Colace (over the counter) 100 mg twice a day.  Use MiraLax (over the counter) for constipation as needed.   Diet - low sodium heart healthy   Complete by: As directed    Discharge instructions   Complete by: As directed    Maintain surgical dressing until follow up in the clinic. If the edges start to pull up, may reinforce with tape. If the dressing is no longer working, may remove and cover with gauze and tape, but must keep the area dry and clean.  Follow up in 2 weeks at [facility]. Call with any questions or concerns.   Increase activity slowly as tolerated   Complete by: As directed    Weight bearing as tolerated with assist device (walker, cane, etc) as directed, use it as long as suggested by your surgeon or therapist, typically at least 4-6 weeks.   TED hose   Complete by: As directed    Use stockings (TED hose) for 2 weeks on both leg(s).  You may remove them at night for sleeping.     Allergies as of 09/14/2019      Reactions  Atorvastatin Other (See Comments)   Muscle pain   Percocet [oxycodone-acetaminophen] Other (See Comments)   hallucinations   Sulfa Antibiotics Hives      Medication List    STOP taking these medications   acetaminophen 650 MG CR tablet Commonly known as: TYLENOL     TAKE these medications   amLODipine 5 MG tablet Commonly known as: NORVASC Take 5 mg by mouth  daily.   aspirin 81 MG chewable tablet Chew 1 tablet (81 mg total) by mouth 2 (two) times daily for 28 days. Then resume home dose of aspirin 81 mg daily. What changed: See the new instructions.   azelastine 0.1 % nasal spray Commonly known as: ASTELIN Place into both nostrils 2 (two) times daily. Use in each nostril as directed   cetirizine 10 MG tablet Commonly known as: ZYRTEC Take 10 mg by mouth daily.   famotidine 10 MG tablet Commonly known as: PEPCID   ferrous sulfate 325 (65 FE) MG tablet Take 1 tablet (325 mg total) by mouth 2 (two) times daily with a meal for 14 days.   Geritol Complete Tabs Take 1 tablet by mouth daily.   hydrochlorothiazide 25 MG tablet Commonly known as: HYDRODIURIL Take 25 mg by mouth daily.   HYDROcodone-acetaminophen 5-325 MG tablet Commonly known as: NORCO/VICODIN Take 1-2 tablets by mouth every 4 (four) hours as needed for severe pain.   methocarbamol 500 MG tablet Commonly known as: ROBAXIN Take 1 tablet (500 mg total) by mouth every 6 (six) hours as needed for muscle spasms.   NASACORT ALLERGY 24HR NA Place 2 sprays into the nose in the morning and at bedtime.   ondansetron 4 MG tablet Commonly known as: ZOFRAN Take 1 tablet (4 mg total) by mouth every 6 (six) hours as needed for nausea.   pravastatin 80 MG tablet Commonly known as: PRAVACHOL Take 80 mg by mouth daily.   PROBIOTIC DAILY PO Take by mouth.   ramipril 5 MG capsule Commonly known as: ALTACE Take 5 mg by mouth 2 (two) times daily.   traMADol 50 MG tablet Commonly known as: ULTRAM Take 1-2 tablets (50-100 mg total) by mouth every 6 (six) hours as needed for severe pain.            Discharge Care Instructions  (From admission, onward)         Start     Ordered   09/14/19 0000  Change dressing       Comments: Maintain surgical dressing until follow up in the clinic. If the edges start to pull up, may reinforce with tape. If the dressing is no longer  working, may remove and cover with gauze and tape, but must keep the area dry and clean.  Call with any questions or concerns.   09/14/19 1050          Follow-up Information    Paralee Cancel, MD. Schedule an appointment as soon as possible for a visit in 2 weeks.   Specialty: Orthopedic Surgery Contact information: [address] [place] [address] [phone]               Signed: Griffith Citron, PA-C Orthopedic Surgery 09/20/2019, 9:40 AM

## 2020-03-07 ENCOUNTER — Ambulatory Visit: Payer: Medicare PPO | Admitting: Cardiology

## 2020-04-15 NOTE — Progress Notes (Signed)
Cardiology Office Note:    Date:  04/18/2020   ID:  Tracey Rosales, DOB 06-Aug-1950, MRN [id number]  PCP:  Aldean Ast, MD  Cardiologist:  No primary care provider on file.  Electrophysiologist:  None   Referring MD: Aldean Ast, MD   Chief Complaint  Patient presents with  . Hypertension    History of Present Illness:    Tracey Rosales is a 70 y.o. female with a hx of hypertension, hyperlipidemia who presents for follow-up.  She was referred for preop evaluation by Dr. Vinson Moselle, initially seen on 08/25/2019.  She was seen by Dr Vinson Moselle for pre-op evaluatin and EKG showed QT prolongation, so was referred to cardiology for further evaluation.  She is scheduled for knee surgery on 8/3.  For activity, she reports that she used to swim but has not done that since the pandemic started.  She goes for walks intermittently, will walk for up to 10 minutes but activity is limited by knee pain.  She says she can walk up a flight of stairs without stopping but has not done that recently as does not have stairs at her home.  She does state that she goes to her daughter's pool and walks around in the water as this is easier on her knee.  She denies any issues with exertional chest pain or dyspnea.  Denies any lightheadedness, syncope, palpitations, or lower extremity edema.  No smoking history.  Sister had MVR and died of cardiac arrest at age 60.  Father had MI in 44s.  Mother had MI in 47s.     Echocardiogram on 08/29/2019 showed normal biventricular function, no significant valvular disease.  Since last clinic visit, she reports that she is doing well.  She had her knee surgery, reports that it went well.  She is back to walking now.  She denies any chest pain or dyspnea.  She reports no palpitations, lightheadedness, syncope, or lower extremity edema.  Reports BP has been 140s over 70s to 80s when she checks at home.   Past Medical History:  Diagnosis Date  . Arthritis    Hands, knees,   . GERD (gastroesophageal reflux disease)   . Heart murmur 1979  . Hypertension   . Hypochloremia   . PONV (postoperative nausea and vomiting)     Past Surgical History:  Procedure Laterality Date  . BREAST BIOPSY Right 2004   neg  . CHOLECYSTECTOMY  2004  . 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 acetaminophen (TYLENOL) 500 MG tablet Take 500 mg by mouth every 6 (six) hours as needed.  Marland Kitchen azelastine (ASTELIN) 0.1 % nasal spray Place into both nostrils 2 (two) times daily. Use in each nostril as directed  . cetirizine (ZYRTEC) 10 MG tablet Take 10 mg by mouth daily.  . famotidine (PEPCID) 10 MG tablet   . hydrochlorothiazide (HYDRODIURIL) 25 MG tablet Take 25 mg by mouth daily.  . Iron-Vitamins (GERITOL COMPLETE) TABS Take 1 tablet by mouth daily.   . ondansetron (ZOFRAN) 4 MG tablet Take 1 tablet (4 mg total) by mouth every 6 (six) hours as needed for nausea.  . pravastatin (PRAVACHOL) 80 MG tablet Take 80 mg by mouth daily.  . Probiotic Product (PROBIOTIC DAILY PO) Take by mouth.  . ramipril (ALTACE) 5 MG capsule Take  5 mg by mouth 2 (two) times daily.   . Triamcinolone Acetonide (NASACORT ALLERGY 24HR NA) Place 2 sprays into the nose in the morning and at bedtime.   . [DISCONTINUED] amLODipine (NORVASC) 5 MG tablet Take 5 mg by mouth daily.     Allergies:   Atorvastatin, Percocet [oxycodone-acetaminophen], and Sulfa antibiotics   Social History   Socioeconomic History  . Marital status: Married    Spouse name: Not on file  . Number of children: Not on file  . Years of education: Not on file  . Highest education level: Not on file  Occupational History  . Not on file  Tobacco Use  . Smoking status: Never Smoker  . Smokeless tobacco: Never Used  Vaping Use  . Vaping Use: Never  used  Substance and Sexual Activity  . Alcohol use: Yes    Comment: Occ.  . Drug use: Never  . Sexual activity: Not on file  Other Topics Concern  . Not on file  Social History Narrative  . Not on file   Social Determinants of Health   Financial Resource Strain: Not on file  Food Insecurity: Not on file  Transportation Needs: Not on file  Physical Activity: Not on file  Stress: Not on file  Social Connections: Not on file     Family History: The patient's family history includes Heart disease in her father, mother, and sister. There is no history of Breast cancer.  ROS:   Please see the history of present illness.     All other systems reviewed and are negative.  EKGs/Labs/Other Studies Reviewed:    The following studies were reviewed today:   EKG:  EKG is ordered today.  The ekg ordered today demonstrates sinus tachycardia, rate 117, no ST abnormalities   Recent Labs: 08/25/2019: Magnesium 2.2; TSH 1.520 09/14/2019: BUN 13; Creatinine, Ser 0.57; Hemoglobin 10.9; Platelets 265; Potassium 3.7; Sodium 139  Recent Lipid Panel No results found for: CHOL, TRIG, HDL, CHOLHDL, VLDL, LDLCALC, LDLDIRECT  Physical Exam:    VS:  BP (!) 166/78   Pulse (!) 117   Ht 5\' 7"  (1.702 m)   Wt 207 lb 12.8 oz (94.3 kg)   SpO2 99%   BMI 32.55 kg/m     Wt Readings from Last 3 Encounters:  04/18/20 207 lb 12.8 oz (94.3 kg)  09/13/19 199 lb 8 oz (90.5 kg)  09/06/19 199 lb 8 oz (90.5 kg)     GEN:  Well nourished, well developed in no acute distress HEENT: Normal NECK: No JVD; No carotid bruits LYMPHATICS: No lymphadenopathy CARDIAC: RRR, 2/6 systolic heart murmur RESPIRATORY:  Clear to auscultation without rales, wheezing or rhonchi  ABDOMEN: Soft, non-tender, non-distended MUSCULOSKELETAL:  No edema; No deformity  SKIN: Warm and dry NEUROLOGIC:  Alert and oriented x 3 PSYCHIATRIC:  Normal affect   ASSESSMENT:    1. Heart murmur   2. Hypertension, unspecified type   3.  Hyperlipidemia, unspecified hyperlipidemia type    PLAN:    Heart murmur: 2 out of 6 systolic heart murmur.  Echocardiogram on 08/29/2019 showed normal biventricular function, no significant valvular disease.  QT prolongation: Noted on prior EKG at PCPs office, but I do not have that EKG.  QTc 465 on EKG today.  Electrolytes/TSH unremarkable  Hypertension: on HCTZ 25 mg daily, ramipril 5 mg BID, amlodipine 5 mg daily.  BP elevated, will increase amlodipine to 10 mg daily.  Asked patient to check BP daily for next 2 weeks and  call with results.  Hyperlipidemia: on pravastatin 80 mg daily.  LDL 145 on 04/11/2019.  10-year ASCVD risk score 24%.  Will check calcium score to guide how aggressive to be in lowering cholesterol  RTC in 6 months  Medication Adjustments/Labs and Tests Ordered: Current medicines are reviewed at length with the patient today.  Concerns regarding medicines are outlined above.  Orders Placed This Encounter  Procedures  . CT CARDIAC SCORING (SELF PAY ONLY)  . EKG 12-Lead   Meds ordered this encounter  Medications  . amLODipine (NORVASC) 10 MG tablet    Sig: Take 1 tablet (10 mg total) by mouth daily.    Dispense:  90 tablet    Refill:  3    Dose increase    Patient Instructions  Medication Instructions:  INCREASE amlodipine to 10 mg daily  Please check your blood pressure at home daily, write it down.  Call the office or send message via Mychart with the readings in 2 weeks for Dr. Bjorn PippinSchumann to review.   *If you need a refill on your cardiac medications before your next appointment, please call your pharmacy*  Testing/Procedures: CT coronary calcium score. This test [address] done at [address]. This is $99 out of pocket.   Coronary CalciumScan A coronary calcium scan is an imaging test used to look for deposits of calcium and other fatty materials (plaques) in the inner lining of the blood vessels of the heart (coronary arteries). These  deposits of calcium and plaques can partly clog and narrow the coronary arteries without producing any symptoms or warning signs. This puts a person at risk for a heart attack. This test can detect these deposits before symptoms develop. Tell a health care provider about:  Any allergies you have.  All medicines you are taking, including vitamins, herbs, eye drops, creams, and over-the-counter medicines.  Any problems you or family members have had with anesthetic medicines.  Any blood disorders you have.  Any surgeries you have had.  Any medical conditions you have.  Whether you are pregnant or may be pregnant. What are the risks? Generally, this is a safe procedure. However, problems may occur, including:  Harm to a pregnant woman and her unborn baby. This test involves the use of radiation. Radiation exposure can be dangerous to a pregnant woman and her unborn baby. If you are pregnant, you generally should not have this procedure done.  Slight increase in the risk of cancer. This is because of the radiation involved in the test. What happens before the procedure? No preparation is needed for this procedure. What happens during the procedure?  You will undress and remove any jewelry around your neck or chest.  You will put on a hospital gown.  Sticky electrodes will be placed on your chest. The electrodes will be connected to an electrocardiogram (ECG) machine to record a tracing of the electrical activity of your heart.  A CT scanner will take pictures of your heart. During this time, you will be asked to lie still and hold your breath for 2-3 seconds while a picture of your heart is being taken. The procedure may vary among health care providers and hospitals. What happens after the procedure?  You can get dressed.  You can return to your normal activities.  It is up to you to get the results of your test. Ask your health care provider, or the department that is doing the  test, when your results will be ready.  Summary  A coronary calcium scan is an imaging test used to look for deposits of calcium and other fatty materials (plaques) in the inner lining of the blood vessels of the heart (coronary arteries).  Generally, this is a safe procedure. Tell your health care provider if you are pregnant or may be pregnant.  No preparation is needed for this procedure.  A CT scanner will take pictures of your heart.  You can return to your normal activities after the scan is done. This information is not intended to replace advice given to you by your health care provider. Make sure you discuss any questions you have with your health care provider. Document Released: 07/26/2007 Document Revised: 12/17/2015 Document Reviewed: 12/17/2015 Elsevier Interactive Patient Education  2017 [employer].     Follow-Up: At [facility], you and your health needs are our priority.  As part of our continuing mission to provide you with exceptional heart care, we have created designated Provider Care Teams.  These Care Teams include your primary Cardiologist (physician) and Advanced Practice Providers (APPs -  Physician Assistants and Nurse Practitioners) who all work together to provide you with the care you need, when you need it.  We recommend signing up for the patient portal called "MyChart".  Sign up information is provided on this After Visit Summary.  MyChart is used to connect with patients for Virtual Visits (Telemedicine).  Patients are able to view lab/test results, encounter notes, upcoming appointments, etc.  Non-urgent messages can be sent to your provider as well.   To learn more about what you can do with MyChart, go to [email or web address].    Your next appointment:   6 month(s)  The format for your next appointment:   In Person  Provider:   Epifanio Lesches, MD      Signed, Little Ishikawa, MD  04/18/2020 10:32 AM    [facility]

## 2020-04-18 ENCOUNTER — Ambulatory Visit: Payer: Medicare PPO | Admitting: Cardiology

## 2020-04-18 ENCOUNTER — Encounter: Payer: Self-pay | Admitting: Cardiology

## 2020-04-18 ENCOUNTER — Other Ambulatory Visit: Payer: Self-pay

## 2020-04-18 VITALS — BP 166/78 | HR 117 | Ht 67.0 in | Wt 207.8 lb

## 2020-04-18 DIAGNOSIS — R011 Cardiac murmur, unspecified: Secondary | ICD-10-CM | POA: Diagnosis not present

## 2020-04-18 DIAGNOSIS — E785 Hyperlipidemia, unspecified: Secondary | ICD-10-CM

## 2020-04-18 DIAGNOSIS — I10 Essential (primary) hypertension: Secondary | ICD-10-CM

## 2020-04-18 MED ORDER — AMLODIPINE BESYLATE 10 MG PO TABS: 10.0000 mg | ORAL_TABLET | Freq: Every day | ORAL | 3 refills | Status: DC

## 2020-04-18 NOTE — Patient Instructions (Signed)
Medication Instructions:  INCREASE amlodipine to 10 mg daily  Please check your blood pressure at home daily, write it down.  Call the office or send message via Mychart with the readings in 2 weeks for Dr. Bjorn Pippin to review.   *If you need a refill on your cardiac medications before your next appointment, please call your pharmacy*  Testing/Procedures: CT coronary calcium score. This test is done at [address]. This is $99 out of pocket.   Coronary CalciumScan A coronary calcium scan is an imaging test used to look for deposits of calcium and other fatty materials (plaques) in the inner lining of the blood vessels of the heart (coronary arteries). These deposits of calcium and plaques can partly clog and narrow the coronary arteries without producing any symptoms or warning signs. This puts a person at risk for a heart attack. This test can detect these deposits before symptoms develop. Tell a health care provider about:  Any allergies you have.  All medicines you are taking, including vitamins, herbs, eye drops, creams, and over-the-counter medicines.  Any problems you or family members have had with anesthetic medicines.  Any blood disorders you have.  Any surgeries you have had.  Any medical conditions you have.  Whether you are pregnant or may be pregnant. What are the risks? Generally, this is a safe procedure. However, problems may occur, including:  Harm to a pregnant woman and her unborn baby. This test involves the use of radiation. Radiation exposure can be dangerous to a pregnant woman and her unborn baby. If you are pregnant, you generally should not have this procedure done.  Slight increase in the risk of cancer. This is because of the radiation involved in the test. What happens before the procedure? No preparation is needed for this procedure. What happens during the procedure?  You will undress and remove any jewelry around your neck or  chest.  You will put on a hospital gown.  Sticky electrodes will be placed on your chest. The electrodes will be connected to an electrocardiogram (ECG) machine to record a tracing of the electrical activity of your heart.  A CT scanner will take pictures of your heart. During this time, you will be asked to lie still and hold your breath for 2-3 seconds while a picture of your heart is being taken. The procedure may vary among health care providers and hospitals. What happens after the procedure?  You can get dressed.  You can return to your normal activities.  It is up to you to get the results of your test. Ask your health care provider, or the department that is doing the test, when your results will be ready. Summary  A coronary calcium scan is an imaging test used to look for deposits of calcium and other fatty materials (plaques) in the inner lining of the blood vessels of the heart (coronary arteries).  Generally, this is a safe procedure. Tell your health care provider if you are pregnant or may be pregnant.  No preparation is needed for this procedure.  A CT scanner will take pictures of your heart.  You can return to your normal activities after the scan is done. This information is not intended to replace advice given to you by your health care provider. Make sure you discuss any questions you have with your health care provider. Document Released: 07/26/2007 Document Revised: 12/17/2015 Document Reviewed: 12/17/2015 Elsevier Interactive Patient Education  2017 [employer].  Follow-Up: At [facility], you and your health needs are our priority.  As part of our continuing mission to provide you with exceptional heart care, we have created designated Provider Care Teams.  These Care Teams include your primary Cardiologist (physician) and Advanced Practice Providers (APPs -  Physician Assistants and Nurse Practitioners) who all work together to provide you with the  care you need, when you need it.  We recommend signing up for the patient portal called "MyChart".  Sign up information is provided on this After Visit Summary.  MyChart is used to connect with patients for Virtual Visits (Telemedicine).  Patients are able to view lab/test results, encounter notes, upcoming appointments, etc.  Non-urgent messages can be sent to your provider as well.   To learn more about what you can do with MyChart, go to [email or web address].    Your next appointment:   6 month(s)  The format for your next appointment:   In Person  Provider:   Epifanio Lesches, MD

## 2020-05-16 ENCOUNTER — Ambulatory Visit (INDEPENDENT_AMBULATORY_CARE_PROVIDER_SITE_OTHER)
Admission: RE | Admit: 2020-05-16 | Discharge: 2020-05-16 | Disposition: A | Discharge status: Home / Self Care | Payer: Self-pay | Source: Ambulatory Visit | Attending: Cardiology | Admitting: Cardiology

## 2020-05-16 ENCOUNTER — Other Ambulatory Visit: Payer: Self-pay

## 2020-05-16 DIAGNOSIS — E785 Hyperlipidemia, unspecified: Secondary | ICD-10-CM

## 2020-05-18 ENCOUNTER — Other Ambulatory Visit: Payer: Self-pay | Admitting: *Deleted

## 2020-05-18 MED ORDER — ROSUVASTATIN CALCIUM 10 MG PO TABS: 10.0000 mg | ORAL_TABLET | Freq: Every day | ORAL | 3 refills | Status: DC

## 2020-06-12 DIAGNOSIS — N952 Postmenopausal atrophic vaginitis: Secondary | ICD-10-CM | POA: Insufficient documentation

## 2020-10-22 ENCOUNTER — Ambulatory Visit: Payer: Medicare PPO | Admitting: Cardiology

## 2020-12-18 DIAGNOSIS — R7309 Other abnormal glucose: Secondary | ICD-10-CM | POA: Insufficient documentation

## 2021-04-14 NOTE — Progress Notes (Signed)
Cardiology Office Note:    Date:  04/18/2021   ID:  [name], DOB 06/30/1950, MRN [id number]  PCP:  Jenell Milliner, MD  Cardiologist:  Little Ishikawa, MD  Electrophysiologist:  None   Referring MD: Aldean Ast, MD   Chief Complaint  Patient presents with   Hyperlipidemia    History of Present Illness:    Tracey Rosales is a 71 y.o. female with a hx of hypertension, hyperlipidemia who presents for follow-up.  She was referred for preop evaluation by Dr. Vinson Moselle, initially seen on 08/25/2019.  She was seen by Dr Vinson Moselle for pre-op evaluatin and EKG showed QT prolongation, so was referred to cardiology for further evaluation.  She is scheduled for knee surgery on 8/3.  For activity, she reports that she used to swim but has not done that since the pandemic started.  She goes for walks intermittently, will walk for up to 10 minutes but activity is limited by knee pain.  She says she can walk up a flight of stairs without stopping but has not done that recently as does not have stairs at her home.  She does state that she goes to her daughter's pool and walks around in the water as this is easier on her knee.  She denies any issues with exertional chest pain or dyspnea.  Denies any lightheadedness, syncope, palpitations, or lower extremity edema.  No smoking history.  Sister had MVR and died of cardiac arrest at age 43.  Father had MI in 60s.  Mother had MI in 13s.     Echocardiogram on 08/29/2019 showed normal biventricular function, no significant valvular disease.  Calcium score on 05/16/2020 was 7 (42nd percentile).  Since last clinic visit, she reports that she is doing well.  Denies any chest pain, dyspnea, lightheadedness, syncope, or palpitations.  Mild ankle swelling at end of day. Walks 3 times per week for 1 mile.  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   amLODipine (NORVASC) 10 MG tablet Take 1 tablet (10 mg total) by mouth daily.   Aspirin 81 MG CAPS Take by mouth.   azelastine (ASTELIN) 0.1 % nasal spray Place into both nostrils 2 (two) times daily. Use in each nostril as directed   cetirizine (ZYRTEC) 10 MG tablet Take 10 mg by mouth daily.   famotidine (PEPCID) 10 MG tablet as needed.   hydrochlorothiazide (HYDRODIURIL) 25 MG tablet Take 25 mg by mouth daily.   Iron-Vitamins (GERITOL COMPLETE) TABS Take 1 tablet by mouth daily.    ondansetron (ZOFRAN) 4 MG tablet Take 1 tablet (4 mg total) by mouth every 6 (six) hours as needed for nausea.   pantoprazole (PROTONIX) 40 MG tablet 2 (two) times daily.   Probiotic Product (PROBIOTIC DAILY PO) Take  by mouth.   ramipril (ALTACE) 5 MG capsule Take 5 mg by mouth 2 (two) times daily.    rosuvastatin (CRESTOR) 10 MG tablet Take 1 tablet (10 mg total) by mouth daily.   Triamcinolone Acetonide (NASACORT ALLERGY 24HR NA) Place 2 sprays into the nose in the morning and at bedtime.    [DISCONTINUED] pravastatin (PRAVACHOL) 80 MG tablet Take 80 mg by mouth daily.     Allergies:   Atorvastatin, Percocet [oxycodone-acetaminophen], and Sulfa antibiotics   Social History   Socioeconomic History   Marital status: Married    Spouse name: Not on file   Number of children: Not on file   Years of education: Not on file   Highest education level: Not on file  Occupational  History   Not on file  Tobacco Use   Smoking status: Never   Smokeless tobacco: Never  Vaping Use   Vaping Use: Never used  Substance and Sexual Activity   Alcohol use: Yes    Comment: Occ.   Drug use: Never   Sexual activity: Not on file  Other Topics Concern   Not on file  Social History Narrative   Not on file   Social Determinants of Health   Financial Resource Strain: Not on file  Food Insecurity: Not on file  Transportation Needs: Not on file  Physical Activity: Not on file  Stress: Not on file  Social Connections: Not on file     Family History: The patient's family history includes Heart disease in her father, mother, and sister. There is no history of Breast cancer.  ROS:   Please see the history of present illness.     All other systems reviewed and are negative.  EKGs/Labs/Other Studies Reviewed:    The following studies were reviewed today:   EKG:   04/18/2021: Sinus tachycardia, rate 103, no ST abnormalities   Recent Labs: 04/18/2021: ALT 19; BUN 14; Creatinine, Ser 0.70; Hemoglobin 13.8; Platelets 329; Potassium 4.2; Sodium 142  Recent Lipid Panel    Component Value Date/Time   CHOL 252 (H) 04/18/2021 0839   TRIG 139 04/18/2021 0839   HDL 64 04/18/2021 0839   CHOLHDL 3.9 04/18/2021 0839   LDLCALC 163 (H) 04/18/2021 0839    Physical Exam:    VS:  BP 130/72    Pulse (!) 103    Ht 5\' 7"  (1.702 m)    Wt 200 lb 12.8 oz (91.1 kg)    SpO2 99%    BMI 31.45 kg/m     Wt Readings from Last 3 Encounters:  04/18/21 200 lb 12.8 oz (91.1 kg)  04/18/20 207 lb 12.8 oz (94.3 kg)  09/13/19 199 lb 8 oz (90.5 kg)     GEN:  Well nourished, well developed in no acute distress HEENT: Normal NECK: No JVD; No carotid bruits LYMPHATICS: No lymphadenopathy CARDIAC: RRR, 2/6 systolic heart murmur RESPIRATORY:  Clear to auscultation without rales, wheezing or rhonchi  ABDOMEN: Soft, non-tender, non-distended MUSCULOSKELETAL:  No edema; No deformity  SKIN: Warm  and dry NEUROLOGIC:  Alert and oriented x 3 PSYCHIATRIC:  Normal affect   ASSESSMENT:    1. Heart murmur   2. QT prolongation   3. Hypertension, unspecified type   4. Hyperlipidemia, unspecified hyperlipidemia type     PLAN:    Heart murmur: 2 out of 6 systolic heart murmur.  Echocardiogram on 08/29/2019 showed normal biventricular function, no significant valvular disease.  QT prolongation: QTc 482 on EKG today.  We will  check electrolytes  Hypertension: on HCTZ 25 mg daily, ramipril 5 mg BID, amlodipine 10 mg daily.  Appears controlled.  Hyperlipidemia: on pravastatin 80 mg daily,  LDL 145 on 04/11/2019.  10-year ASCVD risk score 24%.  Calcium score on 05/16/2020 was 7 (42nd percentile).  Pravastatin switched to rosuvastatin 10 mg daily, she reports she stopped taking rosuvastatin because was having pain in her thigh.  However she was later found to have an orthopedic injury that was likely the cause of her leg pain.  She is willing to try taking rosuvastatin again, will trial 10 mg daily  RTC in 1 year  Medication Adjustments/Labs and Tests Ordered: Current medicines are reviewed at length with the patient today.  Concerns regarding medicines are outlined above.  Orders Placed This Encounter  Procedures   Comprehensive metabolic panel   CBC   Lipid panel   EKG 12-Lead   Meds ordered this encounter  Medications   rosuvastatin (CRESTOR) 10 MG tablet    Sig: Take 1 tablet (10 mg total) by mouth daily.    Dispense:  90 tablet    Refill:  3    Stop pravastatin    Patient Instructions  Medication Instructions:  STOP pravastatin RESTART rosuvastatin (Crestor) 10 mg daily  *If you need a refill on your cardiac medications before your next appointment, please call your pharmacy*   Lab Work: CMET, CBC, Lipid today  If you have labs (blood work) drawn today and your tests are completely normal, you will receive your results only by: MyChart Message (if you have MyChart) OR A  paper copy in the mail If you have any lab test that is abnormal or we need to change your treatment, we will call you to review the results.  Follow-Up: At [facility], you and your health needs are our priority.  As part of our continuing mission to provide you with exceptional heart care, we have created designated Provider Care Teams.  These Care Teams include your primary Cardiologist (physician) and Advanced Practice Providers (APPs -  Physician Assistants and Nurse Practitioners) who all work together to provide you with the care you need, when you need it.  We recommend signing up for the patient portal called "MyChart".  Sign up information is provided on this After Visit Summary.  MyChart is used to connect with patients for Virtual Visits (Telemedicine).  Patients are able to view lab/test results, encounter notes, upcoming appointments, etc.  Non-urgent messages can be sent to your provider as well.   To learn more about what you can do with MyChart, go to [email or web address].    Your next appointment:   12 month(s)  The format for your next appointment:   In Person  Provider:   Dr. Bjorn Pippin    Signed, Little Ishikawa, MD  04/18/2021 8:49 PM    [facility]

## 2021-04-18 ENCOUNTER — Ambulatory Visit: Payer: Medicare PPO | Admitting: Cardiology

## 2021-04-18 ENCOUNTER — Encounter: Payer: Self-pay | Admitting: Cardiology

## 2021-04-18 ENCOUNTER — Other Ambulatory Visit: Payer: Self-pay

## 2021-04-18 VITALS — BP 130/72 | HR 103 | Ht 67.0 in | Wt 200.8 lb

## 2021-04-18 DIAGNOSIS — R9431 Abnormal electrocardiogram [ECG] [EKG]: Secondary | ICD-10-CM | POA: Diagnosis not present

## 2021-04-18 DIAGNOSIS — I10 Essential (primary) hypertension: Secondary | ICD-10-CM

## 2021-04-18 DIAGNOSIS — E785 Hyperlipidemia, unspecified: Secondary | ICD-10-CM

## 2021-04-18 DIAGNOSIS — R011 Cardiac murmur, unspecified: Secondary | ICD-10-CM

## 2021-04-18 MED ORDER — ROSUVASTATIN CALCIUM 10 MG PO TABS: 10.0000 mg | ORAL_TABLET | Freq: Every day | ORAL | 3 refills | Status: DC

## 2021-04-18 NOTE — Patient Instructions (Signed)
Medication Instructions:  ?STOP pravastatin ?RESTART rosuvastatin (Crestor) 10 mg daily ? ?*If you need a refill on your cardiac medications before your next appointment, please call your pharmacy* ? ? ?Lab Work: ?CMET, CBC, Lipid today ? ?If you have labs (blood work) drawn today and your tests are completely normal, you will receive your results only by: ?MyChart Message (if you have MyChart) OR ?A paper copy in the mail ?If you have any lab test that is abnormal or we need to change your treatment, we will call you to review the results. ? ?Follow-Up: ?At [facility], you and your health needs are our priority.  As part of our continuing mission to provide you with exceptional heart care, we have created designated Provider Care Teams.  These Care Teams include your primary Cardiologist (physician) and Advanced Practice Providers (APPs -  Physician Assistants and Nurse Practitioners) who all work together to provide you with the care you need, when you need it. ? ?We recommend signing up for the patient portal called "MyChart".  Sign up information is provided on this After Visit Summary.  MyChart is used to connect with patients for Virtual Visits (Telemedicine).  Patients are able to view lab/test results, encounter notes, upcoming appointments, etc.  Non-urgent messages can be sent to your provider as well.   ?To learn more about what you can do with MyChart, go to [email or web address].   ? ?Your next appointment:   ?12 month(s) ? ?The format for your next appointment:   ?In Person ? ?Provider:   ?Dr. Bjorn Pippin ? ?

## 2021-04-19 ENCOUNTER — Other Ambulatory Visit: Payer: Self-pay | Admitting: Cardiology

## 2021-04-23 ENCOUNTER — Other Ambulatory Visit: Payer: Self-pay | Admitting: *Deleted

## 2021-04-23 DIAGNOSIS — E785 Hyperlipidemia, unspecified: Secondary | ICD-10-CM

## 2021-05-01 LAB — LIPID PANEL
Chol/HDL Ratio: 3.9 ratio (ref 0.0–4.4)
Cholesterol, Total: 252 mg/dL — ABNORMAL HIGH (ref 100–199)
HDL: 64 mg/dL (ref >39)
LDL Chol Calc (NIH): 163 mg/dL — ABNORMAL HIGH (ref 0–99)
Triglycerides: 139 mg/dL (ref 0–149)
VLDL Cholesterol Cal: 25 mg/dL (ref 5–40)

## 2021-05-01 LAB — COMPREHENSIVE METABOLIC PANEL
ALT: 19 IU/L (ref 0–32)
AST: 17 IU/L (ref 0–40)
Albumin/Globulin Ratio: 2 (ref 1.2–2.2)
Albumin: 4.9 g/dL — ABNORMAL HIGH (ref 3.7–4.7)
Alkaline Phosphatase: 116 IU/L (ref 44–121)
BUN/Creatinine Ratio: 20 (ref 12–28)
BUN: 14 mg/dL (ref 8–27)
Bilirubin Total: 0.5 mg/dL (ref 0.0–1.2)
Calcium: 10.2 mg/dL (ref 8.7–10.3)
Chloride: 101 mmol/L (ref 96–106)
Creatinine, Ser: 0.7 mg/dL (ref 0.57–1.00)
Globulin, Total: 2.5 g/dL (ref 1.5–4.5)
Glucose: 133 mg/dL — ABNORMAL HIGH (ref 70–99)
Potassium: 4.2 mmol/L (ref 3.5–5.2)
Sodium: 142 mmol/L (ref 134–144)
Total Protein: 7.4 g/dL (ref 6.0–8.5)
eGFR: 92 mL/min/{1.73_m2} (ref >59)

## 2021-05-01 LAB — CBC
Hematocrit: 41.5 % (ref 34.0–46.6)
Hemoglobin: 13.8 g/dL (ref 11.1–15.9)
MCH: 27.2 pg (ref 26.6–33.0)
MCHC: 33.3 g/dL (ref 31.5–35.7)
MCV: 82 fL (ref 79–97)
Platelets: 329 10*3/uL (ref 150–450)
RBC: 5.07 x10E6/uL (ref 3.77–5.28)
RDW: 13.6 % (ref 11.7–15.4)
WBC: 10 10*3/uL (ref 3.4–10.8)

## 2021-05-18 IMAGING — CT CT CARDIAC CORONARY ARTERY CALCIUM SCORE
3 series · 14 of 20 positions shown, 15 images · non-contrast
Comparison: None.
COMPARISON: None.

Addendum:
EXAM:
OVER-READ INTERPRETATION  CT CHEST

The following report is an over-read performed by radiologist Dr.
Aresti Rialas [REDACTED] on 05/16/2020. This
over-read does not include interpretation of cardiac or coronary
anatomy or pathology. The coronary calcium score/coronary CTA
interpretation by the cardiologist is attached.
CLINICAL DATA: Cardiovascular Disease Risk stratification
Coronary Calcium Score
TECHNIQUE: A gated, non-contrast computed tomography scan of the heart was
performed using 3mm slice thickness. Axial images were analyzed on a
dedicated workstation. Calcium scoring of the coronary arteries was
performed using the Agatston method.

[Series 2: casc 3.0 bv41 2 bestsyst 36 % · axial · 0.45mm/px · z∈[-204,-132]mm · 4 of 41 slices shown, 5 images]
[im 9/41  vessel]
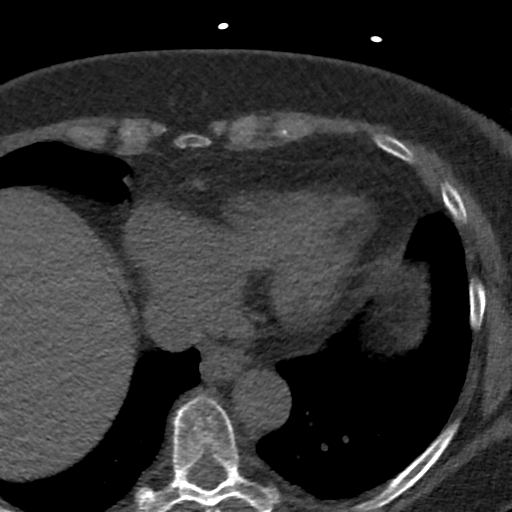
[im 9/41  lung]
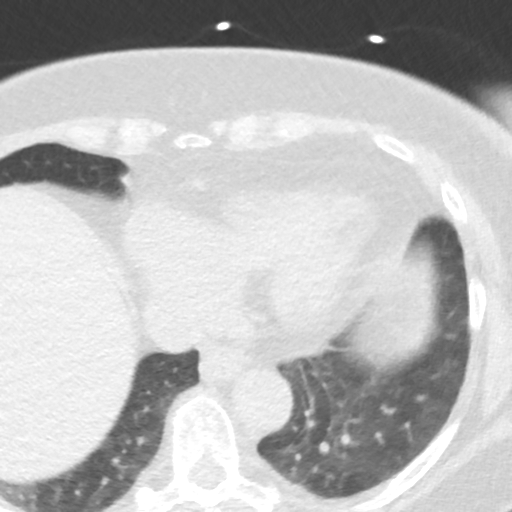
[im 17/41  vessel]
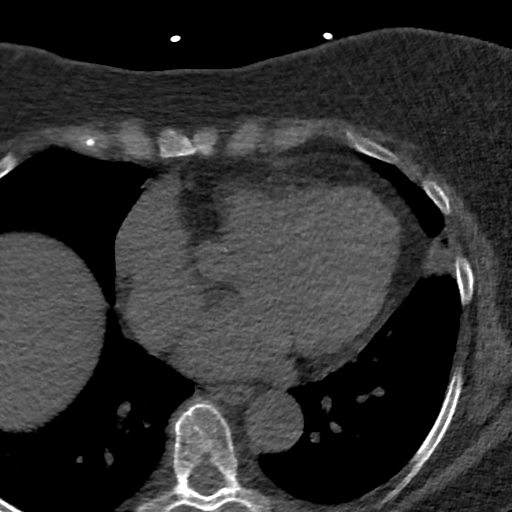
[im 25/41  vessel]
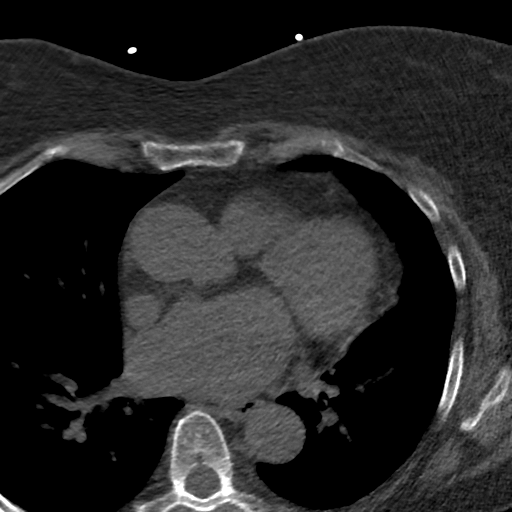
[im 33/41  vessel]
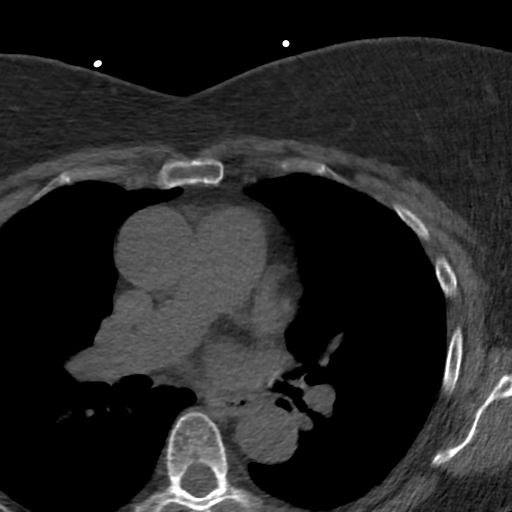

[Series 3: lung 36 % · axial · 0.68mm/px · z∈[-210,-130]mm · 5 of 41 slices shown]
[im 7/41  lung]
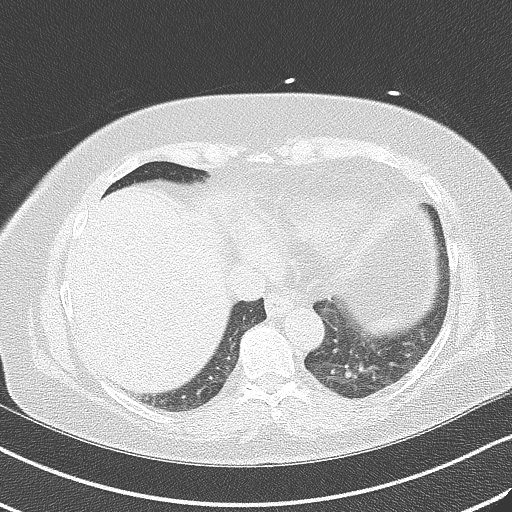
[im 14/41  lung]
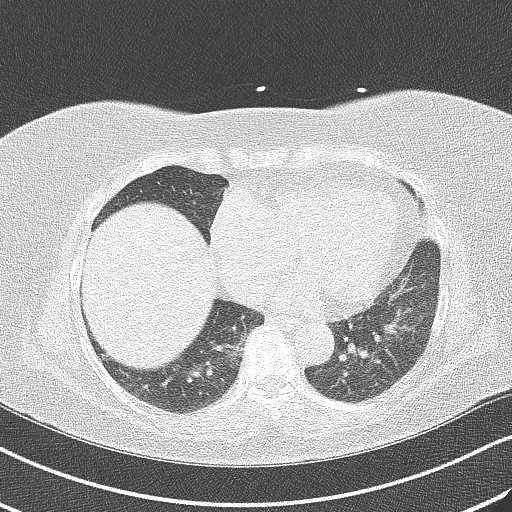
[im 21/41  lung]
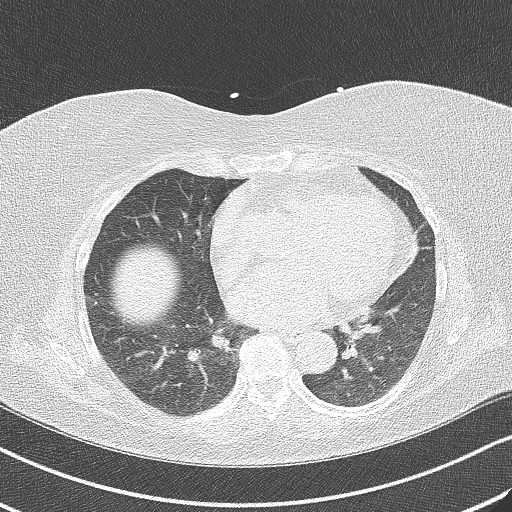
[im 27/41  lung]
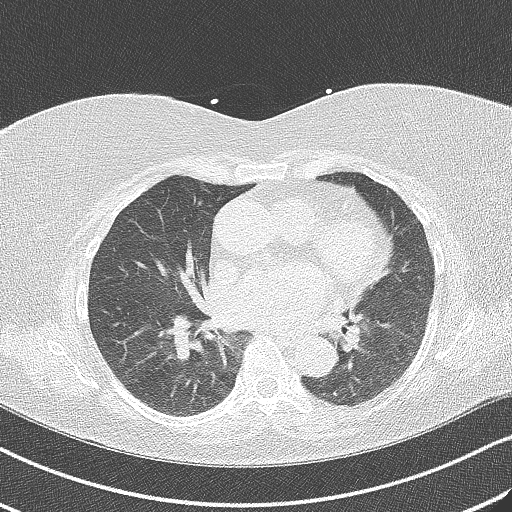
[im 34/41  lung]
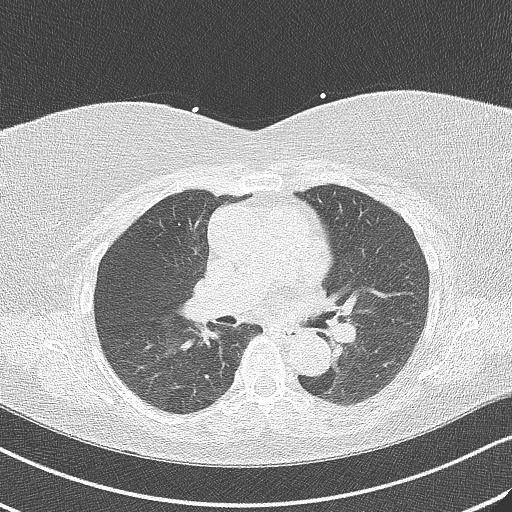

[Series 4: lung st 36 % · axial · 0.68mm/px · z∈[-210,-130]mm · 5 of 41 slices shown]
[im 7/41  lung]
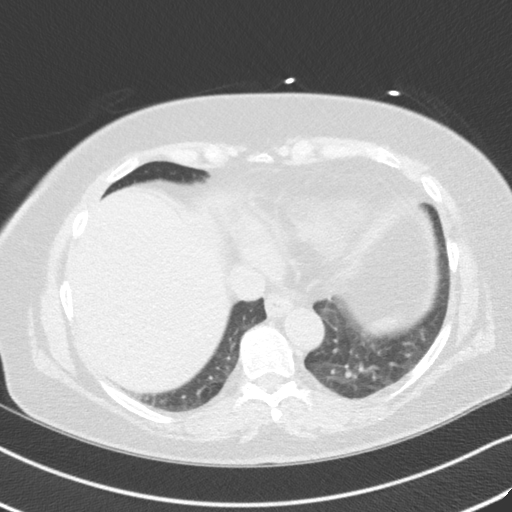
[im 14/41  lung]
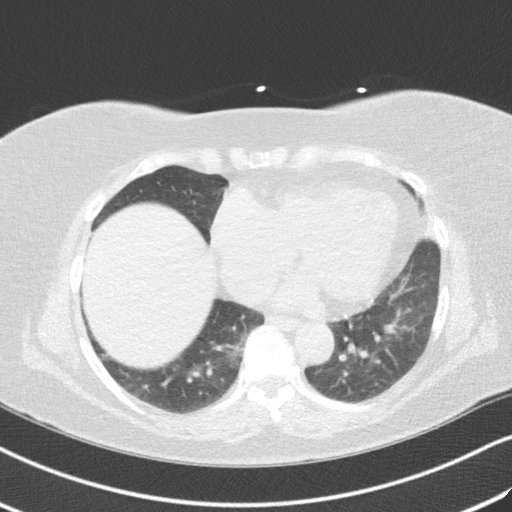
[im 21/41  lung]
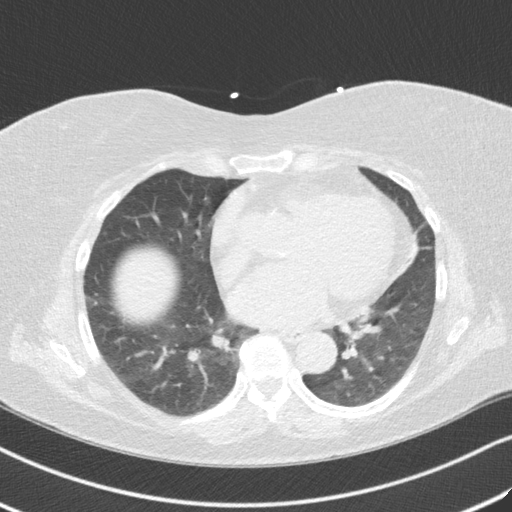
[im 27/41  lung]
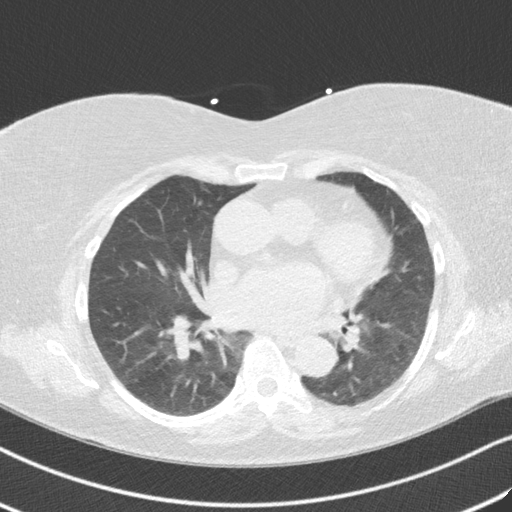
[im 34/41  lung]
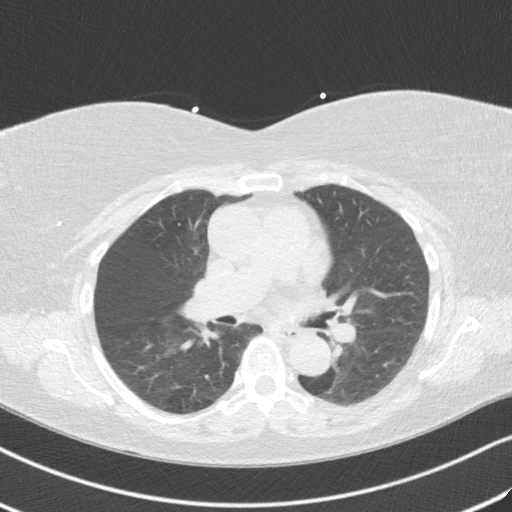

[14 of 20 positions shown; findings below may reference images not displayed]

FINDINGS: Atherosclerotic calcifications in the thoracic aorta. Within the
visualized portions of the thorax there are no suspicious appearing
pulmonary nodules or masses, there is no acute consolidative
airspace disease, no pleural effusions, no pneumothorax and no
lymphadenopathy. Visualized portions of the upper abdomen are
unremarkable. There are no aggressive appearing lytic or blastic
lesions noted in the visualized portions of the skeleton.
IMPRESSION: 1.  Aortic Atherosclerosis (KHO4M-S66.6).
FINDINGS: Coronary arteries: Normal origins.

Coronary Calcium Score:

Left main: 0

Left anterior descending artery: 0

Left circumflex artery: 7

Right coronary artery: 0

Total: 7

Percentile: 42

Pericardium: Normal.

Ascending Aorta: Normal caliber.

Non-cardiac: See separate report from [REDACTED].
IMPRESSION: Coronary calcium score of 7. This was 42 percentile for age-, race-,
and sex-matched controls.



If CAC=0, it is reasonable to withhold statin therapy and reassess
in 5 to 10 years, as long as higher risk conditions are absent
(diabetes mellitus, family history of premature CHD in first degree
relatives (males <55 years; females <65 years), cigarette smoking,
or LDL >=190 mg/dL).

If CAC is 1 to 99, it is reasonable to initiate statin therapy for
patients >=55 years of age.

If CAC is >=100 or >=75th percentile, it is reasonable to initiate
statin therapy at any age.

Cardiology referral should be considered for patients with CAC
scores >=400 or >=75th percentile.

*6613 AHA/ACC/AACVPR/AAPA/ABC/YUNADA/XUOSEN FARAAX/KLPIGBB/[place]/BERGHAHN/FLAME/SUBAIDAH
Guideline on the Management of Blood Cholesterol: A Report of the
American College of Cardiology/American Heart Association Task Force
on Clinical Practice Guidelines. J Am Coll Cardiol.
6238;73(24):2948-2037.

*** End of Addendum ***
EXAM:
OVER-READ INTERPRETATION  CT CHEST

The following report is an over-read performed by radiologist Dr.
Aresti Rialas [REDACTED] on 05/16/2020. This
over-read does not include interpretation of cardiac or coronary
anatomy or pathology. The coronary calcium score/coronary CTA
interpretation by the cardiologist is attached.
FINDINGS: Atherosclerotic calcifications in the thoracic aorta. Within the
visualized portions of the thorax there are no suspicious appearing
pulmonary nodules or masses, there is no acute consolidative
airspace disease, no pleural effusions, no pneumothorax and no
lymphadenopathy. Visualized portions of the upper abdomen are
unremarkable. There are no aggressive appearing lytic or blastic
lesions noted in the visualized portions of the skeleton.
IMPRESSION: 1.  Aortic Atherosclerosis (KHO4M-S66.6).

## 2021-06-17 ENCOUNTER — Encounter: Payer: Self-pay | Admitting: Cardiology

## 2021-06-17 DIAGNOSIS — E785 Hyperlipidemia, unspecified: Secondary | ICD-10-CM

## 2021-06-17 NOTE — Telephone Encounter (Signed)
Unable to tolerate statins, recommend referral to pharmacy lipid clinic ?

## 2021-06-21 ENCOUNTER — Ambulatory Visit: Payer: Medicare PPO

## 2021-06-24 ENCOUNTER — Ambulatory Visit: Payer: Medicare PPO | Admitting: Pharmacist

## 2021-06-24 ENCOUNTER — Telehealth: Payer: Self-pay | Admitting: Pharmacist

## 2021-06-24 VITALS — BP 150/76 | HR 70 | Resp 16 | Ht 67.0 in | Wt 203.2 lb

## 2021-06-24 DIAGNOSIS — R931 Abnormal findings on diagnostic imaging of heart and coronary circulation: Secondary | ICD-10-CM

## 2021-06-24 DIAGNOSIS — E785 Hyperlipidemia, unspecified: Secondary | ICD-10-CM

## 2021-06-24 DIAGNOSIS — E7800 Pure hypercholesterolemia, unspecified: Secondary | ICD-10-CM

## 2021-06-24 MED ORDER — PRALUENT 75 MG/ML ~~LOC~~ SOAJ: 75.0000 mg | SUBCUTANEOUS | 11 refills | Status: DC

## 2021-06-24 NOTE — Progress Notes (Signed)
Patient ID: Tracey Rosales                 DOB: Oct 31, 1950                    MRN: [id number] ? ? ? ? ?HPI: ?Tracey Rosales is a 71 y.o. female patient referred to lipid clinic by Dr Gardiner Rhyme. PMH is significant for HTN, HLD and statin intolerance. ? ?Patient presents today in good spirits. Is able to tolerate pravastatin 80mg  however it has not been effective in reducing LDL.  Had leg myalgias with atorvastatin and hand pain with rosuvastatin. ? ?Does not smoke. Drinks alcohol occasionally. ? ?Has a strong family history of CAD, mother, father and sister. ? ?Reports she follows a heart healthy diet. Husband is diabetic so they have reduced carbohydrates as well ? ?Current Medications: Pravastatin 80mg  daily ? ?Intolerances:  ?Pravastatin ?Atorvastatin ?Rosuvastatin ? ?Risk Factors:  ?HLD ?HTN ?Elevated coronary calcium score ? ?LDL goal: <70 ? ?Labs: TC 252, Trigs 139, HDL 64, LDL 163 (04/23/21 ? ?Coronary calcium score of 7. This was 56 percentile for age-, race-, and sex-matched controls. ? ?Past Medical History:  ?Diagnosis Date  ? Arthritis   ? Hands, knees,  ? GERD (gastroesophageal reflux disease)   ? Heart murmur 1979  ? Hypertension   ? Hypochloremia   ? PONV (postoperative nausea and vomiting)   ? ? ?Current Outpatient Medications on File Prior to Visit  ?Medication Sig Dispense Refill  ? ondansetron (ZOFRAN) 4 MG tablet Take by mouth.    ? pravastatin (PRAVACHOL) 80 MG tablet Take by mouth.    ? acetaminophen (TYLENOL) 500 MG tablet Take 500 mg by mouth every 6 (six) hours as needed.    ? alendronate (FOSAMAX) 35 MG tablet Take 35 mg by mouth once a week.    ? amLODipine (NORVASC) 10 MG tablet TAKE 1 TABLET BY MOUTH EVERY DAY 90 tablet 3  ? aspirin 81 MG EC tablet Take 1 tablet by mouth daily.    ? azelastine (ASTELIN) 0.1 % nasal spray Place into both nostrils 2 (two) times daily. Use in each nostril as directed    ? cetirizine (ZYRTEC) 10 MG tablet Take 10 mg by mouth daily.    ? estradiol  (ESTRACE) 0.1 MG/GM vaginal cream SMARTSIG:2 Gram(s) Vaginal Twice a Week    ? famotidine (PEPCID) 10 MG tablet as needed.    ? famotidine (PEPCID) 20 MG tablet famotidine 20 mg tablet ? TAKE 1 TABLET BY MOUTH EVERYDAY AT BEDTIME    ? hydrochlorothiazide (HYDRODIURIL) 25 MG tablet Take 25 mg by mouth daily.    ? Iron-Vitamins (GERITOL COMPLETE) TABS Take 1 tablet by mouth daily.     ? pantoprazole (PROTONIX) 40 MG tablet 2 (two) times daily.    ? polyethylene glycol (GOLYTELY) 236 g solution peg 3350-electrolytes 236 gram-22.74 gram-6.74 gram-5.86 gram solution    ? Probiotic Product (PROBIOTIC DAILY PO) Take by mouth.    ? ramipril (ALTACE) 5 MG capsule Take 5 mg by mouth 2 (two) times daily.     ? rosuvastatin (CRESTOR) 10 MG tablet Take 1 tablet (10 mg total) by mouth daily. 90 tablet 3  ? Triamcinolone Acetonide (NASACORT ALLERGY 24HR NA) Place 2 sprays into the nose in the morning and at bedtime.     ? ?No current facility-administered medications on file prior to visit.  ? ? ?Allergies  ?Allergen Reactions  ? Atorvastatin Other (See Comments)  ?  Muscle  pain ?  ? Percocet [Oxycodone-Acetaminophen] Other (See Comments)  ?  hallucinations  ? Sulfa Antibiotics Hives  ? ? ?Assessment/Plan: ? ?1. Hyperlipidemia - Patient LDL 163 which is above goal of <70. Unfortunately is not toelrant of high intensity statins. Unlikely Zetia will have rech goal.  Recommend she start PCSK9i. ? ?Using demo pen, educated patient on mechanism of action, storage, site selection, administration, and possible adverse effects. Patient was able to demonstrate in room.  Will complete PA and contact patient when approved. May qualify for copay card due to state health plan.  Has preexisting lab order but recommended she postpone until late July to have lipid panel rechecked once she has been on Repatha/Praluent for a few months.  Patient voiced understanding. ? ?Continue pravastatin 80mg  daiky ?Start Repatha/Praluent sq q 14 days ?Check  lipid panel in 2-3 months. ? ?Karren Cobble, PharmD, BCACP, [place], CPP ?[address], [address] ?[place], [place], [address] ?Phone: [phone], Fax: [phone]  ?  ?

## 2021-06-24 NOTE — Telephone Encounter (Signed)
PA SENT  ?Meryn Ciani (Key: [id number]) ?

## 2021-06-24 NOTE — Patient Instructions (Addendum)
It was nice meeting you today ? ?We would like your LDL (bad cholesterol) less than 70 ? ?Please continue your pravastatin 80mg  daily ? ?We will start a new medication called Repatha or Praluent which you will inject once every 2 weeks ? ?We will complete the prior authorization for you and contact you when it is approved ? ?Once you start the medication we will recheck your cholesterol in about 2-3 months ? ?Please call with any questions! ? ?Karren Cobble, PharmD, BCACP, [place], CPP ?[address], [address] ?[place], [place], [address] ?Phone: [phone], Fax: [phone]  ?

## 2021-06-24 NOTE — Telephone Encounter (Signed)
Awaiting ?'s on cmm  ? ?Lipid panel ordered and released ?

## 2021-06-24 NOTE — Addendum Note (Signed)
Addended by: Eather Colas on: 06/24/2021 05:01 PM ? ? Modules accepted: Orders ? ?

## 2021-06-24 NOTE — Telephone Encounter (Signed)
Please complete prior authorization for: ? ?Name of medication, dose, and frequency Repatha 140mg  sq q 14 days, Praluent 75mg  sq q 14 days ? ?Lab Orders Requested? no ? ?Which labs? N/a ? ?Estimated date for labs to be scheduled N/A ? ?Does patient need activated copay card? Possibly, has a state health plan that covers prescriptions  ?

## 2021-06-24 NOTE — Telephone Encounter (Signed)
Called and spoke w/pt and stated that the praluent was approved, rx sent, pt instructed to complete fasting labs post 4th dose and voiced understanding and to call if unaffordable.  ?

## 2021-08-04 ENCOUNTER — Encounter: Payer: Self-pay | Admitting: Pharmacist

## 2021-09-08 ENCOUNTER — Other Ambulatory Visit: Payer: Self-pay | Admitting: Cardiology

## 2021-09-09 ENCOUNTER — Encounter: Payer: Self-pay | Admitting: Pharmacist

## 2021-09-09 DIAGNOSIS — E7800 Pure hypercholesterolemia, unspecified: Secondary | ICD-10-CM

## 2021-09-09 DIAGNOSIS — R931 Abnormal findings on diagnostic imaging of heart and coronary circulation: Secondary | ICD-10-CM | POA: Insufficient documentation

## 2021-09-09 MED ORDER — REPATHA SURECLICK 140 MG/ML ~~LOC~~ SOAJ: 1.0000 mL | SUBCUTANEOUS | 3 refills | Status: DC

## 2021-09-09 NOTE — Telephone Encounter (Signed)
PA for Repatha submitted.  Key:  [id number]. PA approved, sent in other encounter

## 2021-09-16 ENCOUNTER — Encounter: Payer: Self-pay | Admitting: Pharmacist Clinician (PhC)/ Clinical Pharmacy Specialist

## 2021-10-11 LAB — LIPID PANEL
Chol/HDL Ratio: 2.8 ratio (ref 0.0–4.4)
Cholesterol, Total: 159 mg/dL (ref 100–199)
HDL: 57 mg/dL (ref >39)
LDL Chol Calc (NIH): 63 mg/dL (ref 0–99)
Triglycerides: 245 mg/dL — ABNORMAL HIGH (ref 0–149)
VLDL Cholesterol Cal: 39 mg/dL (ref 5–40)

## 2022-02-27 ENCOUNTER — Other Ambulatory Visit: Payer: Self-pay | Admitting: Cardiology

## 2022-06-16 NOTE — Progress Notes (Unsigned)
Cardiology Office Note:    Date:  06/18/2022   ID:  Tracey Rosales, DOB 1950-11-05, MRN [id number]  PCP:  Jenell Milliner, MD  Cardiologist:  Little Ishikawa, MD  Electrophysiologist:  None   Referring MD: Jenell Milliner, MD   Chief Complaint  Patient presents with   Tachycardia    History of Present Illness:    Tracey Rosales is a 72 y.o. female with a hx of hypertension, hyperlipidemia who presents for follow-up.  She was referred for preop evaluation by Dr. Vinson Moselle, initially seen on 08/25/2019.  She was seen by Dr Vinson Moselle for pre-op evaluatin and EKG showed QT prolongation, so was referred to cardiology for further evaluation.  She is scheduled for knee surgery on 8/3.  For activity, she reports that she used to swim but has not done that since the pandemic started.  She goes for walks intermittently, will walk for up to 10 minutes but activity is limited by knee pain.  She says she can walk up a flight of stairs without stopping but has not done that recently as does not have stairs at her home.  She does state that she goes to her daughter's pool and walks around in the water as this is easier on her knee.  She denies any issues with exertional chest pain or dyspnea.  Denies any lightheadedness, syncope, palpitations, or lower extremity edema.  No smoking history.  Sister had MVR and died of cardiac arrest at age 57.  Father had MI in 24s.  Mother had MI in 24s.     Echocardiogram on 08/29/2019 showed normal biventricular function, no significant valvular disease.  Calcium score on 05/16/2020 was 7 (42nd percentile).  Since last clinic visit, she reports she is doing well.  Denies any chest pain, dyspnea, lightheadedness, syncope, or palpitations.  Does report some left lower extremity swelling.  She started Repatha, tolerating well.  She has not been exercising but is active, she watches her two 4-year-old grandchildren multiple days per week.   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   amLODipine (NORVASC) 10 MG tablet TAKE 1 TABLET BY MOUTH EVERY DAY   aspirin 81 MG EC tablet Take 1 tablet by mouth daily.   azelastine (ASTELIN) 0.1 % nasal spray Place into both nostrils 2 (two) times daily. Use in each nostril as directed   cetirizine (ZYRTEC) 10 MG tablet Take 10 mg by mouth daily.   estradiol (ESTRACE) 0.1 MG/GM vaginal cream SMARTSIG:2 Gram(s) Vaginal Twice a Week   Evolocumab (REPATHA SURECLICK) 140 MG/ML SOAJ Inject 1 mL into the skin every 14 (fourteen) days.   famotidine (PEPCID) 20 MG tablet famotidine 20 mg tablet  TAKE 1 TABLET BY MOUTH EVERYDAY AT BEDTIME   hydrochlorothiazide (HYDRODIURIL) 25 MG tablet Take 25 mg by mouth  daily.   Iron-Vitamins (GERITOL COMPLETE) TABS Take 1 tablet by mouth daily.    ondansetron (ZOFRAN) 4 MG tablet Take by mouth.   pantoprazole (PROTONIX) 40 MG tablet 2 (two) times daily.   Probiotic Product (PROBIOTIC DAILY PO) Take by mouth.   ramipril (ALTACE) 5 MG capsule Take 5 mg by mouth 2 (two) times daily.    Triamcinolone Acetonide (NASACORT ALLERGY 24HR NA) Place 2 sprays into the nose in the morning and at bedtime.      Allergies:   Rosuvastatin, Atorvastatin, Atorvastatin calcium, Percocet [oxycodone-acetaminophen], Sulfa antibiotics, and Alendronate   Social History   Socioeconomic History   Marital  status: Married    Spouse name: Not on file   Number of children: Not on file   Years of education: Not on file   Highest education level: Not on file  Occupational History   Not on file  Tobacco Use   Smoking status: Never   Smokeless tobacco: Never  Vaping Use   Vaping Use: Never used  Substance and Sexual Activity   Alcohol use: Yes    Comment: Occ.   Drug use: Never   Sexual activity: Not on file  Other Topics Concern   Not on file  Social History Narrative   Not on file   Social Determinants of Health   Financial Resource Strain: Not on file  Food Insecurity: Not on file  Transportation Needs: Not on file  Physical Activity: Not on file  Stress: Not on file  Social Connections: Not on file     Family History: The patient's family history includes Heart disease in her father, mother, and sister. There is no history of Breast cancer.  ROS:   Please see the history of present illness.     All other systems reviewed and are negative.  EKGs/Labs/Other Studies Reviewed:    The following studies were reviewed today:   EKG:   04/18/2021: Sinus tachycardia, rate 103, no ST abnormalities 06/18/2022: Sinus tachycardia, rate 102, no ST abnormalities  Recent Labs: No results found for requested labs within last 365 days.  Recent Lipid Panel    Component Value Date/Time   CHOL 159 10/11/2021 0958   TRIG 245 (H) 10/11/2021 0958   HDL 57 10/11/2021 0958   CHOLHDL 2.8 10/11/2021 0958   LDLCALC 63 10/11/2021 0958    Physical Exam:    VS:  BP 132/80   Pulse (!) 102   Ht 5\' 6"  (1.676 m)   Wt 200 lb 6.4 oz (90.9 kg)   SpO2 99%   BMI 32.35 kg/m     Wt Readings from Last 3 Encounters:  06/18/22 200 lb 6.4 oz (90.9 kg)  06/24/21 203 lb 3.2 oz (92.2 kg)  04/18/21 200 lb 12.8 oz (91.1 kg)     GEN:  Well nourished, well developed in no acute distress HEENT: Normal NECK: No JVD; No carotid bruits LYMPHATICS: No lymphadenopathy CARDIAC: RRR, 2/6 systolic heart  murmur RESPIRATORY:  Clear to auscultation without rales, wheezing or rhonchi  ABDOMEN: Soft, non-tender, non-distended MUSCULOSKELETAL:  No edema; No deformity  SKIN: Warm and dry NEUROLOGIC:  Alert and oriented x 3 PSYCHIATRIC:  Normal affect   ASSESSMENT:    1. Tachycardia   2. Hyperlipidemia, unspecified hyperlipidemia type   3. Heart murmur   4. QT prolongation   5. Hypertension, unspecified type     PLAN:    Tachycardia: Recommend Zio patch x 2 weeks to evaluate for arrhythmia  Hyperlipidemia: on pravastatin 80  mg daily,  LDL 145 on 04/11/2019.  10-year ASCVD risk score 24%.  Calcium score on 05/16/2020 was 7 (42nd percentile).  Pravastatin switched to rosuvastatin 10 mg daily, she reports she stopped taking rosuvastatin because was having myalgias.  Reported myalgias on multiple statins.  Started on Repatha 08/2021.  LDL 63 on 10/11/2021.  Check lipid panel.  Heart murmur: 2 out of 6 systolic heart murmur.  Echocardiogram on 08/29/2019 showed normal biventricular function, no significant valvular disease.  QT prolongation: QTc 482 on EKG at [facility] clinic visit.  Will monitor.  EKG today shows QTC 471  Hypertension: on HCTZ 25 mg daily, ramipril 5 mg BID, amlodipine 10 mg daily.  Appears controlled.  RTC in 1 year  Medication Adjustments/Labs and Tests Ordered: Current medicines are reviewed at length with the patient today.  Concerns regarding medicines are outlined above.  Orders Placed This Encounter  Procedures   Lipid panel   LONG TERM MONITOR (3-14 DAYS)   EKG 12-Lead   No orders of the defined types were placed in this encounter.   Patient Instructions  Medication Instructions:  Your physician recommends that you continue on your current medications as directed. Please refer to the Current Medication list given to you today.  *If you need a refill on your cardiac medications before your next appointment, please call your pharmacy*  Lab Work: Lipid today  If you  have labs (blood work) drawn today and your tests are completely normal, you will receive your results only by: MyChart Message (if you have MyChart) OR A paper copy in the mail If you have any lab test that is abnormal or we need to change your treatment, we will call you to review the results.   Testing/Procedures: Christena Deem- Long Term Monitor Instructions   Your physician has requested you wear a ZIO patch monitor for _14_ days.  This is a single patch monitor.   [employer] supplies one patch monitor per enrollment. Additional stickers are not available. Please do not apply patch if you will be having a Nuclear Stress Test, Echocardiogram, Cardiac CT, MRI, or Chest Xray during the period you would be wearing the monitor. The patch cannot be worn during these tests. You cannot remove and re-apply the ZIO XT patch monitor.  Your ZIO patch monitor will be sent Fed Ex from [employer] directly to your home address. It may take 3-5 days to receive your monitor after you have been enrolled.  Once you have received your monitor, please review the enclosed instructions. Your monitor has already been registered assigning a specific monitor serial # to you.  Billing and Patient Assistance Program Information   We have supplied [employer] with any of your insurance information on file for billing purposes. [employer] offers a sliding scale Patient Assistance Program for patients that do not have insurance, or whose insurance does not completely cover the cost of the ZIO monitor.   You must apply for the Patient Assistance Program to qualify for this discounted rate.     To apply, please call [employer] at [phone], select option 4, then select option 2, and ask to apply for Patient Assistance Program.  Meredeth Ide will ask your household income, and how many people are in your household.  They will quote your out-of-pocket cost based on that information.  [employer] will also be able to set up a 68-month,  interest-free payment plan if needed.  Applying the monitor   Shave hair from upper left chest.  Hold abrader  disc by orange tab. Rub abrader in 40 strokes over the upper left chest as indicated in your monitor instructions.  Clean area with 4 enclosed alcohol pads. Let dry.  Apply patch as indicated in monitor instructions. Patch will be placed under collarbone on left side of chest with arrow pointing upward.  Rub patch adhesive wings for 2 minutes. Remove white label marked "1". Remove the white label marked "2". Rub patch adhesive wings for 2 additional minutes.  While looking in a mirror, press and release button in center of patch. A small green light will flash 3-4 times. This will be your only indicator that the monitor has been turned on. ?  Do not shower for the first 24 hours. You may shower after the first 24 hours.  Press the button if you feel a symptom. You will hear a small click. Record Date, Time and Symptom in the Patient Logbook.  When you are ready to remove the patch, follow instructions on the last 2 pages of the Patient Logbook. Stick patch monitor onto the last page of Patient Logbook.  Place Patient Logbook in the blue and white box.  Use locking tab on box and tape box closed securely.  The blue and white box has prepaid postage on it. Please place it in the mailbox as soon as possible. Your physician should have your test results approximately 7 days after the monitor has been mailed back to [facility].  Call [facility] Customer Care at [phone] if you have questions regarding your ZIO XT patch monitor. Call them immediately if you see an orange light blinking on your monitor.  If your monitor falls off in less than 4 days, contact our Monitor department at [phone]. ?If your monitor becomes loose or falls off after 4 days call [employer] at [phone] for suggestions on securing your monitor.?  Follow-Up: At [facility], you and your health  needs are our priority.  As part of our continuing mission to provide you with exceptional heart care, we have created designated Provider Care Teams.  These Care Teams include your primary Cardiologist (physician) and Advanced Practice Providers (APPs -  Physician Assistants and Nurse Practitioners) who all work together to provide you with the care you need, when you need it.  We recommend signing up for the patient portal called "MyChart".  Sign up information is provided on this After Visit Summary.  MyChart is used to connect with patients for Virtual Visits (Telemedicine).  Patients are able to view lab/test results, encounter notes, upcoming appointments, etc.  Non-urgent messages can be sent to your provider as well.   To learn more about what you can do with MyChart, go to [email or web address].    Your next appointment:   12 month(s)  Provider:   Little Ishikawa, MD         Signed, Little Ishikawa, MD  06/18/2022 9:21 AM    [facility]

## 2022-06-18 ENCOUNTER — Encounter: Payer: Self-pay | Admitting: Cardiology

## 2022-06-18 ENCOUNTER — Ambulatory Visit: Payer: Medicare PPO | Attending: Cardiology | Admitting: Cardiology

## 2022-06-18 ENCOUNTER — Ambulatory Visit (INDEPENDENT_AMBULATORY_CARE_PROVIDER_SITE_OTHER): Payer: Medicare PPO

## 2022-06-18 VITALS — BP 132/80 | HR 102 | Ht 66.0 in | Wt 200.4 lb

## 2022-06-18 DIAGNOSIS — R9431 Abnormal electrocardiogram [ECG] [EKG]: Secondary | ICD-10-CM

## 2022-06-18 DIAGNOSIS — R000 Tachycardia, unspecified: Secondary | ICD-10-CM | POA: Diagnosis not present

## 2022-06-18 DIAGNOSIS — E785 Hyperlipidemia, unspecified: Secondary | ICD-10-CM

## 2022-06-18 DIAGNOSIS — R011 Cardiac murmur, unspecified: Secondary | ICD-10-CM | POA: Diagnosis not present

## 2022-06-18 DIAGNOSIS — I10 Essential (primary) hypertension: Secondary | ICD-10-CM

## 2022-06-18 LAB — LIPID PANEL
Chol/HDL Ratio: 2.9 ratio (ref 0.0–4.4)
Cholesterol, Total: 190 mg/dL (ref 100–199)
HDL: 65 mg/dL (ref >39)
LDL Chol Calc (NIH): 88 mg/dL (ref 0–99)
Triglycerides: 224 mg/dL — ABNORMAL HIGH (ref 0–149)
VLDL Cholesterol Cal: 37 mg/dL (ref 5–40)

## 2022-06-18 NOTE — Patient Instructions (Signed)
Medication Instructions:  Your physician recommends that you continue on your current medications as directed. Please refer to the Current Medication list given to you today.  *If you need a refill on your cardiac medications before your next appointment, please call your pharmacy*  Lab Work: Lipid today  If you have labs (blood work) drawn today and your tests are completely normal, you will receive your results only by: MyChart Message (if you have MyChart) OR A paper copy in the mail If you have any lab test that is abnormal or we need to change your treatment, we will call you to review the results.   Testing/Procedures: Christena Deem- Long Term Monitor Instructions   Your physician has requested you wear a ZIO patch monitor for _14_ days.  This is a single patch monitor.   [employer] supplies one patch monitor per enrollment. Additional stickers are not available. Please do not apply patch if you will be having a Nuclear Stress Test, Echocardiogram, Cardiac CT, MRI, or Chest Xray during the period you would be wearing the monitor. The patch cannot be worn during these tests. You cannot remove and re-apply the ZIO XT patch monitor.  Your ZIO patch monitor will be sent Fed Ex from [employer] directly to your home address. It may take 3-5 days to receive your monitor after you have been enrolled.  Once you have received your monitor, please review the enclosed instructions. Your monitor has already been registered assigning a specific monitor serial # to you.  Billing and Patient Assistance Program Information   We have supplied [employer] with any of your insurance information on file for billing purposes. [employer] offers a sliding scale Patient Assistance Program for patients that do not have insurance, or whose insurance does not completely cover the cost of the ZIO monitor.   You must apply for the Patient Assistance Program to qualify for this discounted rate.     To apply, please call  [employer] at [phone], select option 4, then select option 2, and ask to apply for Patient Assistance Program.  Meredeth Ide will ask your household income, and how many people are in your household.  They will quote your out-of-pocket cost based on that information.  [employer] will also be able to set up a 58-month, interest-free payment plan if needed.  Applying the monitor   Shave hair from upper left chest.  Hold abrader disc by orange tab. Rub abrader in 40 strokes over the upper left chest as indicated in your monitor instructions.  Clean area with 4 enclosed alcohol pads. Let dry.  Apply patch as indicated in monitor instructions. Patch will be placed under collarbone on left side of chest with arrow pointing upward.  Rub patch adhesive wings for 2 minutes. Remove white label marked "1". Remove the white label marked "2". Rub patch adhesive wings for 2 additional minutes.  While looking in a mirror, press and release button in center of patch. A small green light will flash 3-4 times. This will be your only indicator that the monitor has been turned on. ?  Do not shower for the first 24 hours. You may shower after the first 24 hours.  Press the button if you feel a symptom. You will hear a small click. Record Date, Time and Symptom in the Patient Logbook.  When you are ready to remove the patch, follow instructions on the last 2 pages of the Patient Logbook. Stick patch monitor onto the last page of Patient Logbook.  Place  Patient Logbook in the blue and white box.  Use locking tab on box and tape box closed securely.  The blue and white box has prepaid postage on it. Please place it in the mailbox as soon as possible. Your physician should have your test results approximately 7 days after the monitor has been mailed back to [facility].  Call [facility] Customer Care at [phone] if you have questions regarding your ZIO XT patch monitor. Call them immediately if you see an orange light  blinking on your monitor.  If your monitor falls off in less than 4 days, contact our Monitor department at [phone]. ?If your monitor becomes loose or falls off after 4 days call [employer] at [phone] for suggestions on securing your monitor.?  Follow-Up: At [facility], you and your health needs are our priority.  As part of our continuing mission to provide you with exceptional heart care, we have created designated Provider Care Teams.  These Care Teams include your primary Cardiologist (physician) and Advanced Practice Providers (APPs -  Physician Assistants and Nurse Practitioners) who all work together to provide you with the care you need, when you need it.  We recommend signing up for the patient portal called "MyChart".  Sign up information is provided on this After Visit Summary.  MyChart is used to connect with patients for Virtual Visits (Telemedicine).  Patients are able to view lab/test results, encounter notes, upcoming appointments, etc.  Non-urgent messages can be sent to your provider as well.   To learn more about what you can do with MyChart, go to [email or web address].    Your next appointment:   12 month(s)  Provider:   Little Ishikawa, MD

## 2022-06-18 NOTE — Progress Notes (Unsigned)
Enrolled patient for a 14 day Zio XT  monitor to be mailed to patients home  °

## 2022-06-22 DIAGNOSIS — R000 Tachycardia, unspecified: Secondary | ICD-10-CM

## 2022-07-31 ENCOUNTER — Other Ambulatory Visit: Payer: Self-pay | Admitting: Cardiology

## 2022-07-31 DIAGNOSIS — E7800 Pure hypercholesterolemia, unspecified: Secondary | ICD-10-CM

## 2022-07-31 DIAGNOSIS — R931 Abnormal findings on diagnostic imaging of heart and coronary circulation: Secondary | ICD-10-CM

## 2022-08-08 ENCOUNTER — Encounter: Payer: Self-pay | Admitting: Cardiology

## 2022-08-11 ENCOUNTER — Other Ambulatory Visit (HOSPITAL_COMMUNITY): Payer: Self-pay

## 2022-08-11 ENCOUNTER — Telehealth: Payer: Self-pay

## 2022-08-11 NOTE — Telephone Encounter (Signed)
Left voicemail message for the pt to call the clinic.  Pharmacy Patient Advocate Encounter   Prior Authorization for REPATHA has been approved.     Effective dates: 08/11/22 through 08/11/23   Haze Rushing, CPhT Pharmacy Patient Advocate Specialist Direct Number: [phone] Fax: [phone]

## 2022-08-11 NOTE — Telephone Encounter (Signed)
Pharmacy Patient Advocate Encounter   Received notification from [facility] that prior authorization for REPATHA 140 MG/ML INJ is needed.    PA submitted on 08/11/22 Key [id number] Status is pending  Haze Rushing, CPhT Pharmacy Patient Advocate Specialist Direct Number: [phone] Fax: [phone]

## 2022-08-11 NOTE — Telephone Encounter (Signed)
Pharmacy Patient Advocate Encounter  Prior Authorization for REPATHA has been approved.    Effective dates: 08/11/22 through 08/11/23  Haze Rushing, CPhT Pharmacy Patient Advocate Specialist Direct Number: [phone] Fax: [phone]

## 2022-08-11 NOTE — Telephone Encounter (Signed)
PA initiated, please see separate encounter for updates on determination. (I will route you back in once a decision has been made)  Benino Korinek, CPhT Pharmacy Patient Advocate Specialist Direct Number: [phone] Fax: [phone]  

## 2023-05-03 ENCOUNTER — Other Ambulatory Visit: Payer: Self-pay | Admitting: Cardiology

## 2023-07-07 ENCOUNTER — Ambulatory Visit: Payer: Medicare PPO | Admitting: Cardiology

## 2023-07-09 ENCOUNTER — Ambulatory Visit: Payer: Medicare PPO | Admitting: Cardiology

## 2023-07-13 ENCOUNTER — Other Ambulatory Visit (HOSPITAL_COMMUNITY): Payer: Self-pay

## 2023-07-13 ENCOUNTER — Telehealth: Payer: Self-pay | Admitting: Pharmacy Technician

## 2023-07-13 NOTE — Telephone Encounter (Signed)
 Pharmacy Patient Advocate Encounter   Received notification from Fax that prior authorization for Repatha is required/requested.   Insurance verification completed.   The patient is insured through [facility] .   Per test claim: PA required; PA submitted to above mentioned insurance via Fax Key/confirmation #/EOC faxed Status is pending

## 2023-07-13 NOTE — Telephone Encounter (Signed)
 Pharmacy Patient Advocate Encounter  Received notification from [facility] that Prior Authorization for Repatha  has been APPROVED from 07/13/23 to 07/12/24. Ran test claim, Copay is $141.00- 3 months. This test claim was processed through [facility]- copay amounts may vary at other pharmacies due to pharmacy/plan contracts, or as the patient moves through the different stages of their insurance plan.   PA #/Case ID/Reference #: [id number]

## 2023-07-24 ENCOUNTER — Other Ambulatory Visit: Payer: Self-pay | Admitting: Cardiology

## 2023-07-24 DIAGNOSIS — R931 Abnormal findings on diagnostic imaging of heart and coronary circulation: Secondary | ICD-10-CM

## 2023-07-24 DIAGNOSIS — E7800 Pure hypercholesterolemia, unspecified: Secondary | ICD-10-CM

## 2023-08-02 ENCOUNTER — Other Ambulatory Visit: Payer: Self-pay | Admitting: Cardiology

## 2023-08-04 ENCOUNTER — Encounter: Payer: Self-pay | Admitting: Cardiology

## 2023-08-05 ENCOUNTER — Other Ambulatory Visit: Payer: Self-pay | Admitting: *Deleted

## 2023-08-05 MED ORDER — AMLODIPINE BESYLATE 10 MG PO TABS: 10.0000 mg | ORAL_TABLET | Freq: Every day | ORAL | 3 refills | Status: AC

## 2023-09-29 ENCOUNTER — Telehealth: Payer: Self-pay

## 2023-09-29 NOTE — Telephone Encounter (Signed)
   Pre-operative Risk Assessment    Patient Name: Tracey Rosales  DOB: 1950-03-24 MRN: [id number]   Date of last office visit: 06/18/22 Tracey NANAS, MD Date of next office visit: 10/13/23 Tracey NANAS, MD   Request for Surgical Clearance    Procedure:  LEFT TOTAL KNEE ARTHROPLASTY  Date of Surgery:  Clearance 01/05/24                                Surgeon:  DR DONNICE CAR Surgeon's Group or Practice Name:  [facility] Phone number:  [phone] Fax number:  [phone]   Type of Clearance Requested:   - Medical  - Pharmacy:  Hold Aspirin      Type of Anesthesia:  Not Indicated   Additional requests/questions:    SignedLucie DELENA Rosales   09/29/2023, 9:19 AM

## 2023-09-29 NOTE — Telephone Encounter (Signed)
   Name: Tracey Rosales  DOB: 11-18-1950  MRN: [id number]  Primary Cardiologist: Lonni LITTIE Nanas, MD  Chart reviewed as part of pre-operative protocol coverage. The patient has an upcoming visit scheduled with Dr. Nanas on 10/13/2023 at which time clearance can be addressed in case there are any issues that would impact surgical recommendations.  Left total knee arthroplasty is not scheduled until 01/05/2024 as below. I added preop FYI to appointment note so that provider is aware to address at time of outpatient visit.  Per office protocol the cardiology provider should forward their finalized clearance decision and recommendations regarding antiplatelet therapy to the requesting party below.    This message will also be routed to Dr Nanas for input on holding aspirin  as requested below so that this information is available to the clearing provider at time of patient's appointment.   I will route this message as FYI to requesting party and remove this message from the preop box as separate preop APP input not needed at this time.   Please call with any questions.  Lum LITTIE Louis, NP  09/29/2023, 4:28 PM

## 2023-10-11 NOTE — Progress Notes (Unsigned)
 Cardiology Office Note:    Date:  10/13/2023   ID:  Tracey Rosales, DOB Jun 29, 1950, MRN [id number]  PCP:  Lora Odor, FNP  Cardiologist:  Lonni LITTIE Nanas, MD  Electrophysiologist:  None   Referring MD: Derenda Rockers, MD   Chief Complaint  Patient presents with   Pre-op Exam    History of Present Illness:    Tracey Rosales is a 73 y.o. female with a hx of hypertension, hyperlipidemia who presents for follow-up.  She was referred for preop evaluation by Dr. Derenda, initially seen on 08/25/2019.  She was seen by Dr Derenda for pre-op evaluatin and EKG showed QT prolongation, so was referred to cardiology for further evaluation.  She is scheduled for knee surgery on 8/3.  For activity, she reports that she used to swim but has not done that since the pandemic started.  She goes for walks intermittently, will walk for up to 10 minutes but activity is limited by knee pain.  She says she can walk up a flight of stairs without stopping but has not done that recently as does not have stairs at her home.  She does state that she goes to her daughter's pool and walks around in the water  as this is easier on her knee.  She denies any issues with exertional chest pain or dyspnea.  Denies any lightheadedness, syncope, palpitations, or lower extremity edema.  No smoking history.  Sister had MVR and died of cardiac arrest at age 70.  Father had MI in 16s.  Mother had MI in 97s.     Echocardiogram on 08/29/2019 showed normal biventricular function, no significant valvular disease.  Calcium  score on 05/16/2020 was 7 (42nd percentile).  Zio patch x 14 days 06/2022 showed 7 episodes of SVT with longest lasting 13 beats.  Since last clinic visit, she reports she is doing well.  Denies any chest pain, dyspnea, lightheadedness, syncope, or palpitations.  Some swelling in left leg.  She can walk up flight of stairs without stopping, denies any exertional chest pain or dyspnea.  Does report she is  limited by her knee pain.  She reports BP 130s to 140s when checks at home.  Planning left knee replacement 12/2023.   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   amLODipine  (NORVASC ) 10 MG tablet Take 1 tablet (10 mg total) by mouth daily.   aspirin  81 MG EC tablet Take 1 tablet by mouth daily.   azelastine  (ASTELIN ) 0.1 % nasal spray Place into both nostrils 2 (two) times daily. Use in each nostril as directed   cetirizine (ZYRTEC) 10 MG tablet Take 10 mg by mouth daily.   estradiol (ESTRACE) 0.1 MG/GM vaginal cream SMARTSIG:2 Gram(s) Vaginal Twice a Week   Evolocumab  (REPATHA  SURECLICK) 140 MG/ML SOAJ INJECT 1 ML INTO THE  SKIN EVERY 14 (FOURTEEN) DAYS.   famotidine  (PEPCID ) 20 MG tablet famotidine  20 mg tablet  TAKE 1 TABLET BY MOUTH EVERYDAY AT BEDTIME   hydrochlorothiazide  (HYDRODIURIL ) 25 MG tablet Take 25 mg by mouth daily.   Iron-Vitamins (GERITOL COMPLETE) TABS Take 1 tablet by mouth daily.    ondansetron  (ZOFRAN ) 4 MG tablet Take by mouth.   pantoprazole (PROTONIX) 40 MG tablet 2 (two) times daily.   Probiotic Product (PROBIOTIC DAILY PO) Take by mouth.   ramipril  (ALTACE ) 5 MG capsule Take 5 mg by mouth 2 (two) times daily.    Triamcinolone  Acetonide (NASACORT  ALLERGY 24HR NA) Place 2 sprays into the nose in the morning and at bedtime.      Allergies:   Rosuvastatin ,  Atorvastatin, Atorvastatin calcium , Percocet [oxycodone-acetaminophen ], Sulfa antibiotics, and Alendronate   Social History   Socioeconomic History   Marital status: Married    Spouse name: Not on file   Number of children: Not on file   Years of education: Not on file   Highest education level: Not on file  Occupational History   Not on file  Tobacco Use   Smoking status: Never   Smokeless tobacco: Never  Vaping Use   Vaping status: Never Used  Substance and Sexual Activity   Alcohol use: Yes    Comment: Occ.   Drug use: Never   Sexual activity: Not on file  Other Topics Concern   Not on file  Social History Narrative   Not on file   Social Drivers of Health   Financial Resource Strain: Low Risk  (07/03/2022)   Received from [facility]   Overall Financial Resource Strain (CARDIA)    Difficulty of Paying Living Expenses: Not hard at all  Food Insecurity: No Food Insecurity (07/03/2022)   Received from [facility]   Hunger Vital Sign    Within the past 12 months, you worried that your food would run out before you got the money to buy more.: Never true    Within the past 12 months, the food you bought just didn't last and you didn't have money to get more.: Never true  Transportation Needs: No Transportation Needs (07/03/2022)   Received from [facility]   PRAPARE - Transportation    Lack of Transportation (Medical): No    Lack of Transportation (Non-Medical): No  Physical Activity: Sufficiently Active (07/03/2022)   Received from [facility]   Exercise Vital Sign    On average, how many days per week do you engage in moderate to strenuous exercise (like a brisk walk)?: 5 days    On average, how many minutes do you engage in exercise at this level?: 30 min  Stress: No Stress Concern Present (07/03/2022)   Received from [facility] - Occupational Stress Questionnaire    Feeling of Stress : Not at all  Social  Connections: Socially Integrated (07/03/2022)   Received from [facility]   Social Connection and Isolation Panel    In a typical week, how many times do you talk on the phone with family, friends, or neighbors?: More than three times a week    How often do you get together with friends or relatives?: More than three times a week    How often do you attend church or religious services?: More than 4 times per year    Do you belong to any clubs or organizations such as church groups, unions,  fraternal or athletic groups, or school groups?: Yes    How often do you attend meetings of the clubs or organizations you belong to?: More than 4 times per year    Are you married, widowed, divorced, separated, never married, or living with a partner?: Married     Family History: The patient's family history includes Heart disease in her father, mother, and sister. There is no history of Breast cancer.  ROS:   Please see the history of present illness.     All other systems reviewed and are negative.  EKGs/Labs/Other Studies Reviewed:    The following studies were reviewed today:   EKG:   04/18/2021: Sinus tachycardia, rate 103, no ST abnormalities 06/18/2022: Sinus tachycardia, rate 102, no ST abnormalities 10/13/2023: Sinus rhythm with PACs, rate 75, no ST abnormalities  Recent Labs: No results found for requested labs within last 365 days.  Recent Lipid Panel    Component Value Date/Time   CHOL 190 06/18/2022 0925   TRIG 224 (H) 06/18/2022 0925   HDL 65 06/18/2022 0925   CHOLHDL 2.9 06/18/2022 0925   LDLCALC 88 06/18/2022 0925    Physical Exam:    VS:  BP (!) 146/78 (BP Location: Right Arm, Patient Position: Sitting, Cuff Size: Normal)   Pulse 75   Ht 5' 6 (1.676 m)   SpO2 95%   BMI 32.35 kg/m     Wt Readings from Last 3 Encounters:  06/18/22 200 lb 6.4 oz (90.9 kg)  06/24/21 203 lb 3.2 oz (92.2 kg)  04/18/21 200 lb 12.8 oz (91.1 kg)     GEN:  Well nourished, well developed  in no acute distress HEENT: Normal NECK: No JVD; No carotid bruits LYMPHATICS: No lymphadenopathy CARDIAC: RRR, 2/6 systolic heart murmur RESPIRATORY:  Clear to auscultation without rales, wheezing or rhonchi  ABDOMEN: Soft, non-tender, non-distended MUSCULOSKELETAL:  No edema; No deformity  SKIN: Warm and dry NEUROLOGIC:  Alert and oriented x 3 PSYCHIATRIC:  Normal affect   ASSESSMENT:    1. Preoperative clearance   2. Heart murmur   3. QT prolongation   4. Hypertension, unspecified type   5. Hyperlipidemia, unspecified hyperlipidemia type      PLAN:    Preop evaluation: Prior to knee replacement.  Good functional capacity, denies any exertional symptoms.  Does have heart murmur on exam, will update echocardiogram.  If unremarkable, no further cardiac workup recommended prior to surgery  Tachycardia: Zio patch x 14 days 06/2022 showed 7 episodes of SVT with longest lasting 13 beats.  Hyperlipidemia: on pravastatin  80 mg daily,  LDL 145 on 04/11/2019.  10-year ASCVD risk score 24%.  Calcium  score on 05/16/2020 was 7 (42nd percentile).  Pravastatin  switched to rosuvastatin  10 mg daily, she reports she stopped taking rosuvastatin  because was having myalgias.  Reported myalgias on multiple statins.  Started on Repatha  08/2021.  LDL 62 on 07/03/2023  Heart murmur: 2 out of 6 systolic murmur, will update echocardiogram  QT prolongation: QTc 482 on EKG at prior clinic visit.  Will monitor.  EKG today shows QTC 462  Hypertension: on HCTZ 25 mg daily, ramipril  5 mg BID, amlodipine  10 mg daily.  Mildly elevated in clinic today.  Asked to check BP twice daily for next week and let us  know results  RTC in 1 year  Medication Adjustments/Labs and Tests Ordered: Current medicines are reviewed at length with the patient today.  Concerns regarding medicines are outlined above.  Orders Placed This Encounter  Procedures  EKG 12-Lead   ECHOCARDIOGRAM COMPLETE   No orders of the defined types were  placed in this encounter.   Patient Instructions  Medication Instructions:  Continue current medications *If you need a refill on your cardiac medications before your next appointment, please call your pharmacy*  Lab Work: none If you have labs (blood work) drawn today and your tests are completely normal, you will receive your results only by: MyChart Message (if you have MyChart) OR A paper copy in the mail If you have any lab test that is abnormal or we need to change your treatment, we will call you to review the results.  Testing/Procedures: Echo  Your physician has requested that you have an echocardiogram. Echocardiography is a painless test that uses sound waves to create images of your heart. It provides your doctor with information about the size and shape of your heart and how well your heart's chambers and valves are working. This procedure takes approximately one hour. There are no restrictions for this procedure. Please do NOT wear cologne, perfume, aftershave, or lotions (deodorant is allowed). Please arrive 15 minutes prior to your appointment time.  Please note: We ask at that you not bring children with you during ultrasound (echo/ vascular) testing. Due to room size and safety concerns, children are not allowed in the ultrasound rooms during exams. Our front office staff cannot provide observation of children in our lobby area while testing is being conducted. An adult accompanying a patient to their appointment will only be allowed in the ultrasound room at the discretion of the ultrasound technician under special circumstances. We apologize for any inconvenience.   Follow-Up: At [facility], you and your health needs are our priority.  As part of our continuing mission to provide you with exceptional heart care, our providers are all part of one team.  This team includes your primary Cardiologist (physician) and Advanced Practice Providers or APPs (Physician  Assistants and Nurse Practitioners) who all work together to provide you with the care you need, when you need it.  Your next appointment:   1 year  Provider:   Dr. Kate  We recommend signing up for the patient portal called MyChart.  Sign up information is provided on this After Visit Summary.  MyChart is used to connect with patients for Virtual Visits (Telemedicine).  Patients are able to view lab/test results, encounter notes, upcoming appointments, etc.  Non-urgent messages can be sent to your provider as well.   To learn more about what you can do with MyChart, go to [email or web address].   Other Instructions Please check blood pressure twice a day for one week and send those reading     Signed, Lonni LITTIE Kate, MD  10/13/2023 12:48 PM    [facility]

## 2023-10-13 ENCOUNTER — Ambulatory Visit: Attending: Cardiology | Admitting: Cardiology

## 2023-10-13 ENCOUNTER — Encounter: Payer: Self-pay | Admitting: Cardiology

## 2023-10-13 VITALS — BP 146/78 | HR 75 | Ht 66.0 in

## 2023-10-13 DIAGNOSIS — I10 Essential (primary) hypertension: Secondary | ICD-10-CM

## 2023-10-13 DIAGNOSIS — E785 Hyperlipidemia, unspecified: Secondary | ICD-10-CM

## 2023-10-13 DIAGNOSIS — R011 Cardiac murmur, unspecified: Secondary | ICD-10-CM | POA: Diagnosis not present

## 2023-10-13 DIAGNOSIS — R9431 Abnormal electrocardiogram [ECG] [EKG]: Secondary | ICD-10-CM | POA: Diagnosis not present

## 2023-10-13 DIAGNOSIS — Z01818 Encounter for other preprocedural examination: Secondary | ICD-10-CM | POA: Diagnosis not present

## 2023-10-13 NOTE — Patient Instructions (Addendum)
 Medication Instructions:  Continue current medications *If you need a refill on your cardiac medications before your next appointment, please call your pharmacy*  Lab Work: none If you have labs (blood work) drawn today and your tests are completely normal, you will receive your results only by: MyChart Message (if you have MyChart) OR A paper copy in the mail If you have any lab test that is abnormal or we need to change your treatment, we will call you to review the results.  Testing/Procedures: Echo  Your physician has requested that you have an echocardiogram. Echocardiography is a painless test that uses sound waves to create images of your heart. It provides your doctor with information about the size and shape of your heart and how well your heart's chambers and valves are working. This procedure takes approximately one hour. There are no restrictions for this procedure. Please do NOT wear cologne, perfume, aftershave, or lotions (deodorant is allowed). Please arrive 15 minutes prior to your appointment time.  Please note: We ask at that you not bring children with you during ultrasound (echo/ vascular) testing. Due to room size and safety concerns, children are not allowed in the ultrasound rooms during exams. Our front office staff cannot provide observation of children in our lobby area while testing is being conducted. An adult accompanying a patient to their appointment will only be allowed in the ultrasound room at the discretion of the ultrasound technician under special circumstances. We apologize for any inconvenience.   Follow-Up: At [facility], you and your health needs are our priority.  As part of our continuing mission to provide you with exceptional heart care, our providers are all part of one team.  This team includes your primary Cardiologist (physician) and Advanced Practice Providers or APPs (Physician Assistants and Nurse Practitioners) who all work  together to provide you with the care you need, when you need it.  Your next appointment:   1 year  Provider:   Dr. Kate  We recommend signing up for the patient portal called MyChart.  Sign up information is provided on this After Visit Summary.  MyChart is used to connect with patients for Virtual Visits (Telemedicine).  Patients are able to view lab/test results, encounter notes, upcoming appointments, etc.  Non-urgent messages can be sent to your provider as well.   To learn more about what you can do with MyChart, go to [email or web address].   Other Instructions Please check blood pressure twice a day for one week and send those reading

## 2023-10-31 ENCOUNTER — Encounter: Payer: Self-pay | Admitting: Cardiology

## 2023-11-25 ENCOUNTER — Ambulatory Visit (HOSPITAL_COMMUNITY)
Admission: RE | Admit: 2023-11-25 | Discharge: 2023-11-25 | Disposition: A | Discharge status: Home / Self Care | Source: Ambulatory Visit | Attending: Cardiovascular Disease | Admitting: Cardiovascular Disease

## 2023-11-25 ENCOUNTER — Ambulatory Visit: Payer: Self-pay | Admitting: Cardiology

## 2023-11-25 DIAGNOSIS — R011 Cardiac murmur, unspecified: Secondary | ICD-10-CM | POA: Insufficient documentation

## 2023-11-25 LAB — ECHOCARDIOGRAM COMPLETE
AR max vel: 2.36 cm2
AV Area VTI: 2.2 cm2
AV Area mean vel: 2.31 cm2
AV Mean grad: 8 mmHg
AV Peak grad: 15.7 mmHg
Ao pk vel: 1.98 m/s
Area-P 1/2: 3.66 cm2
MV M vel: 5.12 m/s
MV Peak grad: 104.9 mmHg
Radius: 0.6 cm
S' Lateral: 3 cm

## 2023-12-28 NOTE — Patient Instructions (Signed)
 SURGICAL WAITING ROOM VISITATION  Patients having surgery or a procedure may have no more than 2 support people in the waiting area - these visitors may rotate.    Children under the age of 60 must have an adult with them who is not the patient.  Visitors with respiratory illnesses are discouraged from visiting and should remain at home.  If the patient needs to stay at the hospital during part of their recovery, the visitor guidelines for inpatient rooms apply. Pre-op nurse will coordinate an appropriate time for 1 support person to accompany patient in pre-op.  This support person may not rotate.    Please refer to the [facility] website for the visitor guidelines for Inpatients (after your surgery is over and you are in a regular room).    Your procedure is scheduled on: 01/05/24   Report to [facility] Main Entrance    Report to admitting at 12:45 PM   Call this number if you have problems the morning of surgery [phone]   Do not eat food :After Midnight.   After Midnight you may have the following liquids until 12:15 PM DAY OF SURGERY  Water  Non-Citrus Juices (without pulp, NO RED-Apple, White grape, White cranberry) Black Coffee (NO MILK/CREAM OR CREAMERS, sugar ok)  Clear Tea (NO MILK/CREAM OR CREAMERS, sugar ok) regular and decaf                             Plain Jell-O (NO RED)                                           Fruit ices (not with fruit pulp, NO RED)                                     Popsicles (NO RED)                                                               Sports drinks like Gatorade (NO RED)    The day of surgery:  Drink ONE (1) Pre-Surgery Clear Ensure at 12:15 PM the morning of surgery. Drink in one sitting. Do not sip.  This drink was given to you during your hospital  pre-op appointment visit. Nothing else to drink after completing the  Pre-Surgery Clear Ensure.          If you have questions, please contact your surgeon's  office.   FOLLOW BOWEL PREP AND ANY ADDITIONAL PRE OP INSTRUCTIONS YOU RECEIVED FROM YOUR SURGEON'S OFFICE!!!     Oral Hygiene is also important to reduce your risk of infection.                                    Remember - BRUSH YOUR TEETH THE MORNING OF SURGERY WITH YOUR REGULAR TOOTHPASTE  DENTURES WILL BE REMOVED PRIOR TO SURGERY PLEASE DO NOT APPLY Poly grip OR ADHESIVES!!!   Stop all vitamins and herbal supplements 7 days before surgery.  Take these medicines the morning of surgery with A SIP OF WATER : Tylenol , Pepcid , Zofran , Pantoprazole                               You may not have any metal on your body including hair pins, jewelry, and body piercing             Do not wear make-up, lotions, powders, perfumes, or deodorant  Do not wear nail polish including gel and S&S, artificial/acrylic nails, or any other type of covering on natural nails including finger and toenails. If you have artificial nails, gel coating, etc. that needs to be removed by a nail salon please have this removed prior to surgery or surgery may need to be canceled/ delayed if the surgeon/ anesthesia feels like they are unable to be safely monitored.   Do not shave  48 hours prior to surgery.    Do not bring valuables to the hospital. [facility] IS NOT             RESPONSIBLE   FOR VALUABLES.   Contacts, glasses, dentures or bridgework may not be worn into surgery.   Bring small overnight bag day of surgery.   DO NOT BRING YOUR HOME MEDICATIONS TO THE HOSPITAL. PHARMACY WILL DISPENSE MEDICATIONS LISTED ON YOUR MEDICATION LIST TO YOU DURING YOUR ADMISSION IN THE HOSPITAL!   Special Instructions: Bring a copy of your healthcare power of attorney and living will documents the day of surgery if you haven't scanned them before.              Please read over the following fact sheets you were given: IF YOU HAVE QUESTIONS ABOUT YOUR PRE-OP INSTRUCTIONS PLEASE CALL [phone].   If you  received a COVID test during your pre-op visit  it is requested that you wear a mask when out in public, stay away from anyone that may not be feeling well and notify your surgeon if you develop symptoms. If you test positive for Covid or have been in contact with anyone that has tested positive in the last 10 days please notify you surgeon.      Pre-operative 4 CHG Bath Instructions  DYNA-Hex 4 Chlorhexidine  Gluconate 4% Solution Antiseptic 4 fl. oz   You can play a key role in reducing the risk of infection after surgery. Your skin needs to be as free of germs as possible. You can reduce the number of germs on your skin by washing with CHG (chlorhexidine  gluconate) soap before surgery. CHG is an antiseptic soap that kills germs and continues to kill germs even after washing.   DO NOT use if you have an allergy to chlorhexidine /CHG or antibacterial soaps. If your skin becomes reddened or irritated, stop using the CHG and notify one of our RNs at   Please shower with the CHG soap starting 4 days before surgery using the following schedule:     Please keep in mind the following:  DO NOT shave, including legs and underarms, starting the day of your first shower.   You may shave your face at any point before/day of surgery.  Place clean sheets on your bed the day you start using CHG soap. Use a clean washcloth (not used since being washed) for each shower. DO NOT sleep with pets once you start using the CHG.  CHG Shower Instructions:  If you choose to wash your hair and private area,  wash first with your normal shampoo/soap.  After you use shampoo/soap, rinse your hair and body thoroughly to remove shampoo/soap residue.  Turn the water  OFF and apply about 3 tablespoons (45 ml) of CHG soap to a CLEAN washcloth.  Apply CHG soap ONLY FROM YOUR NECK DOWN TO YOUR TOES (washing for 3-5 minutes)  DO NOT use CHG soap on face, private areas, open wounds, or sores.  Pay special attention to the area  where your surgery is being performed.  If you are having back surgery, having someone wash your back for you may be helpful. Wait 2 minutes after CHG soap is applied, then you may rinse off the CHG soap.  Pat dry with a clean towel  Put on clean clothes/pajamas   If you choose to wear lotion, please use ONLY the CHG-compatible lotions on the back of this paper.     Additional instructions for the day of surgery: DO NOT APPLY any lotions, deodorants, cologne, or perfumes.   Put on clean/comfortable clothes.  Brush your teeth.  Ask your nurse before applying any prescription medications to the skin.   CHG Compatible Lotions   Aveeno Moisturizing lotion  Cetaphil Moisturizing Cream  Cetaphil Moisturizing Lotion  Clairol Herbal Essence Moisturizing Lotion, Dry Skin  Clairol Herbal Essence Moisturizing Lotion, Extra Dry Skin  Clairol Herbal Essence Moisturizing Lotion, Normal Skin  Curel Age Defying Therapeutic Moisturizing Lotion with Alpha Hydroxy  Curel Extreme Care Body Lotion  Curel Soothing Hands Moisturizing Hand Lotion  Curel Therapeutic Moisturizing Cream, Fragrance-Free  Curel Therapeutic Moisturizing Lotion, Fragrance-Free  Curel Therapeutic Moisturizing Lotion, Original Formula  Eucerin Daily Replenishing Lotion  Eucerin Dry Skin Therapy Plus Alpha Hydroxy Crme  Eucerin Dry Skin Therapy Plus Alpha Hydroxy Lotion  Eucerin Original Crme  Eucerin Original Lotion  Eucerin Plus Crme Eucerin Plus Lotion  Eucerin TriLipid Replenishing Lotion  Keri Anti-Bacterial Hand Lotion  Keri Deep Conditioning Original Lotion Dry Skin Formula Softly Scented  Keri Deep Conditioning Original Lotion, Fragrance Free Sensitive Skin Formula  Keri Lotion Fast Absorbing Fragrance Free Sensitive Skin Formula  Keri Lotion Fast Absorbing Softly Scented Dry Skin Formula  Keri Original Lotion  Keri Skin Renewal Lotion Keri Silky Smooth Lotion  Keri Silky Smooth Sensitive Skin Lotion  Nivea Body  Creamy Conditioning Oil  Nivea Body Extra Enriched Lotion  Nivea Body Original Lotion  Nivea Body Sheer Moisturizing Lotion Nivea Crme  Nivea Skin Firming Lotion  NutraDerm 30 Skin Lotion  NutraDerm Skin Lotion  NutraDerm Therapeutic Skin Cream  NutraDerm Therapeutic Skin Lotion  ProShield Protective Hand Cream  Provon moisturizing lotionView   Pre-Surgery Education Videos:  [email or web address]     Incentive Spirometer  An incentive spirometer is a tool that can help keep your lungs clear and active. This tool measures how well you are filling your lungs with each breath. Taking long deep breaths may help reverse or decrease the chance of developing breathing (pulmonary) problems (especially infection) following: A long period of time when you are unable to move or be active. BEFORE THE PROCEDURE  If the spirometer includes an indicator to show your best effort, your nurse or respiratory therapist will set it to a desired goal. If possible, sit up straight or lean slightly forward. Try not to slouch. Hold the incentive spirometer in an upright position. INSTRUCTIONS FOR USE  Sit on the edge of your bed if possible, or sit up as far as you can in bed or on a chair. Hold the incentive spirometer in  an upright position. Breathe out normally. Place the mouthpiece in your mouth and seal your lips tightly around it. Breathe in slowly and as deeply as possible, raising the piston or the ball toward the top of the column. Hold your breath for 3-5 seconds or for as long as possible. Allow the piston or ball to fall to the bottom of the column. Remove the mouthpiece from your mouth and breathe out normally. Rest for a few seconds and repeat Steps 1 through 7 at least 10 times every 1-2 hours when you are awake. Take your time and take a few normal breaths between deep breaths. The spirometer may include an indicator to show your  best effort. Use the indicator as a goal to work toward during each repetition. After each set of 10 deep breaths, practice coughing to be sure your lungs are clear. If you have an incision (the cut made at the time of surgery), support your incision when coughing by placing a pillow or rolled up towels firmly against it. Once you are able to get out of bed, walk around indoors and cough well. You may stop using the incentive spirometer when instructed by your caregiver.  RISKS AND COMPLICATIONS Take your time so you do not get dizzy or light-headed. If you are in pain, you may need to take or ask for pain medication before doing incentive spirometry. It is harder to take a deep breath if you are having pain. AFTER USE Rest and breathe slowly and easily. It can be helpful to keep track of a log of your progress. Your caregiver can provide you with a simple table to help with this. If you are using the spirometer at home, follow these instructions: SEEK MEDICAL CARE IF:  You are having difficultly using the spirometer. You have trouble using the spirometer as often as instructed. Your pain medication is not giving enough relief while using the spirometer. You develop fever of 100.5 F (38.1 C) or higher. SEEK IMMEDIATE MEDICAL CARE IF:  You cough up bloody sputum that had not been present before. You develop fever of 102 F (38.9 C) or greater. You develop worsening pain at or near the incision site. MAKE SURE YOU:  Understand these instructions. Will watch your condition. Will get help right away if you are not doing well or get worse. Document Released: 06/09/2006 Document Revised: 04/21/2011 Document Reviewed: 08/10/2006 [facility] Patient Information 2014 [place], [place].   ________________________________________________________________________

## 2023-12-28 NOTE — Progress Notes (Signed)
 Date of COVID positive in last 90 days:  PCP - Alfonso Grow, FNP Cardiologist - Lonni Bugler, MD  Cardiac clearnace by Dr. Kate 10/13/23 in Epic   Chest x-ray - N/A EKG - 10/13/23 Epic Stress Test - more than 10 years ago per pt ECHO - 11/25/23 Epic Cardiac Cath - N/A Pacemaker/ICD device last checked:N/A Spinal Cord Stimulator:N/A  Bowel Prep - N/A  Sleep Study - N/A CPAP -   Fasting Blood Sugar - N/A Checks Blood Sugar _____ times a day  Last dose of GLP1 agonist-  N/A GLP1 instructions:  Do not take after     Last dose of SGLT-2 inhibitors-  N/A SGLT-2 instructions:  Do not take after     Blood Thinner Instructions: N/A Last dose:   Time: Aspirin  Instructions: ASA 81, stop 5 days Last Dose:  Activity level: Can go up a flight of stairs and perform activities of daily living without stopping and without symptoms of chest pain or shortness of breath.   Anesthesia review: HTN, heart murmur,  Patient denies shortness of breath, fever, cough and chest pain at PAT appointment  Patient verbalized understanding of instructions that were given to them at the PAT appointment. Patient was also instructed that they will need to review over the PAT instructions again at home before surgery.

## 2023-12-29 ENCOUNTER — Other Ambulatory Visit: Payer: Self-pay

## 2023-12-29 ENCOUNTER — Encounter (HOSPITAL_COMMUNITY): Payer: Self-pay

## 2023-12-29 ENCOUNTER — Encounter (HOSPITAL_COMMUNITY)
Admission: RE | Admit: 2023-12-29 | Discharge: 2023-12-29 | Disposition: A | Discharge status: Home / Self Care | Source: Ambulatory Visit | Attending: Orthopedic Surgery | Admitting: Orthopedic Surgery

## 2023-12-29 VITALS — BP 142/90 | HR 74 | Temp 98.0°F | Resp 16 | Ht 66.0 in | Wt 201.0 lb

## 2023-12-29 DIAGNOSIS — M1712 Unilateral primary osteoarthritis, left knee: Secondary | ICD-10-CM | POA: Diagnosis not present

## 2023-12-29 DIAGNOSIS — Z01818 Encounter for other preprocedural examination: Secondary | ICD-10-CM

## 2023-12-29 DIAGNOSIS — Z01812 Encounter for preprocedural laboratory examination: Secondary | ICD-10-CM | POA: Insufficient documentation

## 2023-12-29 DIAGNOSIS — I10 Essential (primary) hypertension: Secondary | ICD-10-CM | POA: Diagnosis not present

## 2023-12-29 HISTORY — DX: Anemia, unspecified: D64.9

## 2023-12-29 LAB — CBC
HCT: 41.6 % (ref 36.0–46.0)
Hemoglobin: 13.1 g/dL (ref 12.0–15.0)
MCH: 27.4 pg (ref 26.0–34.0)
MCHC: 31.5 g/dL (ref 30.0–36.0)
MCV: 87 fL (ref 80.0–100.0)
Platelets: 336 K/uL (ref 150–400)
RBC: 4.78 MIL/uL (ref 3.87–5.11)
RDW: 13.5 % (ref 11.5–15.5)
WBC: 6.9 K/uL (ref 4.0–10.5)
nRBC: 0 % (ref 0.0–0.2)

## 2023-12-29 LAB — BASIC METABOLIC PANEL WITH GFR
Anion gap: 12 (ref 5–15)
BUN: 9 mg/dL (ref 8–23)
CO2: 25 mmol/L (ref 22–32)
Calcium: 9.6 mg/dL (ref 8.9–10.3)
Chloride: 103 mmol/L (ref 98–111)
Creatinine, Ser: 0.65 mg/dL (ref 0.44–1.00)
GFR, Estimated: >60 mL/min (ref >60)
Glucose, Bld: 106 mg/dL — ABNORMAL HIGH (ref 70–99)
Potassium: 3.9 mmol/L (ref 3.5–5.1)
Sodium: 140 mmol/L (ref 135–145)

## 2023-12-29 LAB — SURGICAL PCR SCREEN
MRSA, PCR: NEGATIVE
Staphylococcus aureus: NEGATIVE

## 2023-12-30 NOTE — Progress Notes (Signed)
 Anesthesia Chart Review   Case: [id number] Date/Time: 01/05/24 1500   Procedure: ARTHROPLASTY, KNEE, TOTAL (Left: Knee)   Anesthesia type: Spinal   Diagnosis: Primary osteoarthritis of left knee [M17.12]   Pre-op diagnosis: Left knee osteoarthritis   Location: [facility] ROOM 09 / [facility]   Surgeons: Ernie Cough, MD       DISCUSSION:73 y.o. never smoker with h/o PONV, HTN, left knee OA scheduled for above procedure 01/05/2024 with Dr. Cough Ernie.   Pt seen by cardiology 10/13/2023. Per OV note, Prior to knee replacement. Good functional capacity, denies any exertional symptoms. Does have heart murmur on exam, will update echocardiogram. If unremarkable, no further cardiac workup recommended prior to surgery  Echo 11/25/2023 with no significant abnormalities.   VS: BP (!) 142/90   Pulse 74   Temp 36.7 C (Oral)   Resp 16   Ht 5' 6 (1.676 m)   Wt 91.2 kg   SpO2 100%   BMI 32.44 kg/m   PROVIDERS: Lora Odor, FNP is PCP       Cardiologist:  Lonni LITTIE Nanas, MD   LABS: Labs reviewed: Acceptable for surgery. (all labs ordered are listed, but only abnormal results are displayed)  Labs Reviewed  BASIC METABOLIC PANEL WITH GFR - Abnormal; Notable for the following components:      Result Value   Glucose, Bld 106 (*)    All other components within normal limits  SURGICAL PCR SCREEN  CBC     IMAGES:   EKG:   CV: Echo 11/25/2023 1. Left ventricular ejection fraction, by estimation, is 65 to 70%. Left  ventricular ejection fraction by 3D volume is 71 %. The left ventricle has  normal function. The left ventricle has no regional wall motion  abnormalities. Left ventricular diastolic   parameters are consistent with Grade I diastolic dysfunction (impaired  relaxation). The average left ventricular global longitudinal strain is  -20.9 %. The global longitudinal strain is normal.   2. Right ventricular systolic function is normal. The right ventricular  size is  normal. Tricuspid regurgitation signal is inadequate for assessing  PA pressure.   3. Left atrial size was mildly dilated.   4. The mitral valve is normal in structure. Trivial mitral valve  regurgitation. No evidence of mitral stenosis.   5. The aortic valve is tricuspid. Aortic valve regurgitation is not  visualized. Aortic valve sclerosis/calcification is present, without any  evidence of aortic stenosis.   6. The inferior vena cava is normal in size with greater than 50%  respiratory variability, suggesting right atrial pressure of 3 mmHg.  Past Medical History:  Diagnosis Date   Anemia    low iron post last knee surgery   Arthritis    Hands, knees,   GERD (gastroesophageal reflux disease)    Heart murmur 1979   Hypertension    Hypochloremia    PONV (postoperative nausea and vomiting)     Past Surgical History:  Procedure Laterality Date   BREAST BIOPSY Right 2004   neg   CATARACT EXTRACTION Bilateral    CHOLECYSTECTOMY  2004   TOTAL KNEE ARTHROPLASTY Right 09/13/2019   Procedure: TOTAL KNEE ARTHROPLASTY;  Surgeon: Ernie Cough, MD;  Location: [facility];  Service: Orthopedics;  Laterality: Right;  70 mins    MEDICATIONS:  acetaminophen  (TYLENOL ) 500 MG tablet   amLODipine  (NORVASC ) 10 MG tablet   Ascorbic Acid (VITAMIN C) 1000 MG tablet   aspirin  81 MG EC tablet   azelastine  (ASTELIN ) 0.1 % nasal  spray   cetirizine (ZYRTEC) 10 MG tablet   Cholecalciferol (D3-1000 PO)   estradiol (ESTRACE) 0.1 MG/GM vaginal cream   Evolocumab  (REPATHA  SURECLICK) 140 MG/ML SOAJ   famotidine  (PEPCID ) 20 MG tablet   hydrochlorothiazide  (HYDRODIURIL ) 25 MG tablet   ondansetron  (ZOFRAN ) 4 MG tablet   pantoprazole  (PROTONIX ) 40 MG tablet   Probiotic Product (PROBIOTIC DAILY PO)   ramipril  (ALTACE ) 10 MG capsule   Triamcinolone  Acetonide (NASACORT  ALLERGY 24HR NA)   No current facility-administered medications for this encounter.   Harlene Hoots Ward, PA-C [facility] Pre-Surgical  Testing [phone]

## 2023-12-30 NOTE — Anesthesia Preprocedure Evaluation (Addendum)
 Anesthesia Evaluation  Patient identified by MRN, date of birth, ID band Patient awake    Reviewed: Allergy & Precautions, H&P , NPO status , Patient's Chart, lab work & pertinent test results  History of Anesthesia Complications (+) PONV and history of anesthetic complications  Airway Mallampati: II  TM Distance: >3 FB Neck ROM: Full    Dental no notable dental hx. (+) Teeth Intact, Dental Advisory Given   Pulmonary neg pulmonary ROS   Pulmonary exam normal breath sounds clear to auscultation       Cardiovascular hypertension, Pt. on medications  Rhythm:Regular Rate:Normal     Neuro/Psych negative neurological ROS  negative psych ROS   GI/Hepatic Neg liver ROS,GERD  Medicated,,  Endo/Other  negative endocrine ROS    Renal/GU negative Renal ROS  negative genitourinary   Musculoskeletal  (+) Arthritis , Osteoarthritis,    Abdominal   Peds  Hematology  (+) Blood dyscrasia, anemia   Anesthesia Other Findings   Reproductive/Obstetrics negative OB ROS                              Anesthesia Physical Anesthesia Plan  ASA: 2  Anesthesia Plan: Spinal   Post-op Pain Management: Regional block* and Ofirmev  IV (intra-op)*   Induction: Intravenous  PONV Risk Score and Plan: 4 or greater and Ondansetron , Dexamethasone  and Propofol  infusion  Airway Management Planned: Natural Airway and Simple Face Mask  Additional Equipment:   Intra-op Plan:   Post-operative Plan:   Informed Consent: I have reviewed the patients History and Physical, chart, labs and discussed the procedure including the risks, benefits and alternatives for the proposed anesthesia with the patient or authorized representative who has indicated his/her understanding and acceptance.     Dental advisory given  Plan Discussed with: CRNA  Anesthesia Plan Comments: (See PAT note 12/29/2023)         Anesthesia  Quick Evaluation

## 2023-12-31 NOTE — H&P (Signed)
 TOTAL KNEE ADMISSION H&P  Patient is being admitted for left total knee arthroplasty.  Therapy Plans: outpatient therapy at [facility] Disposition: Home with husband and friend / kids Planned DVT Prophylaxis: aspirin  81mg  BID DME needed: none PCP: Alfonso Grow - clearance received Cardio: Dr. Kate - clearance received, recent echo done TXA: IV Allergies: statins, sulfa, fosamax Anesthesia Concerns: none BMI: 33.3 Last HgbA1c: Not diabetic   Other: - tramadol /dilaudid  (nausea with opioids), robaxin , tylenol  - Avoids NSAIDs due to GI upset - No hx of VTE or cancer - did very well with right TKA in 2021  Subjective:  Chief Complaint:left knee pain.  HPI: Tracey Rosales, 73 y.o. female, has a history of pain and functional disability in the left knee due to arthritis and has failed non-surgical conservative treatments for greater than 12 weeks to includeNSAID's and/or analgesics, corticosteriod injections, viscosupplementation injections, and activity modification.  Onset of symptoms was gradual, starting 3 years ago with gradually worsening course since that time. The patient noted no past surgery on the left knee(s).  Patient currently rates pain in the left knee(s) at 8 out of 10 with activity. Patient has worsening of pain with activity and weight bearing and pain that interferes with activities of daily living.  Patient has evidence of joint space narrowing by imaging studies. There is no active infection.  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   Osteoarthritis of left knee 05/07/2017   Essential hypertension 03/15/2013   High cholesterol 07/25/2002   Heart murmur 02/23/1977   Past Medical History:  Diagnosis Date   Anemia    low iron post last knee surgery   Arthritis    Hands,  knees,   GERD (gastroesophageal reflux disease)    Heart murmur 1979   Hypertension    Hypochloremia    PONV (postoperative nausea and vomiting)     Past Surgical History:  Procedure Laterality Date   BREAST BIOPSY Right 2004   neg   CATARACT EXTRACTION Bilateral    CHOLECYSTECTOMY  2004   TOTAL KNEE ARTHROPLASTY Right 09/13/2019   Procedure: TOTAL KNEE ARTHROPLASTY;  Surgeon: Ernie Cough, MD;  Location: [facility];  Service: Orthopedics;  Laterality: Right;  70 mins    No current facility-administered medications for this encounter.   Current Outpatient Medications  Medication Sig Dispense Refill Last Dose/Taking   acetaminophen  (TYLENOL ) 500 MG tablet Take 500-1,000 mg by mouth every 6 (six) hours as needed (pain.).   Taking As Needed   amLODipine  (NORVASC ) 10 MG tablet Take 1 tablet (10 mg total) by mouth daily. (Patient taking differently: Take 10 mg by mouth at bedtime.) 90 tablet 3 Taking Differently   Ascorbic Acid (VITAMIN C) 1000 MG tablet Take 1,000 mg by mouth at bedtime.   Taking   aspirin  81 MG EC tablet Take 81 mg by mouth in the morning.   Taking   azelastine  (ASTELIN ) 0.1 % nasal spray Place 2 sprays into both nostrils 2 (two) times daily. Use in each nostril as directed   Taking   cetirizine (ZYRTEC) 10 MG tablet Take 10 mg by mouth at bedtime.   Taking   Cholecalciferol (D3-1000 PO) Take 1,000 Units by mouth at bedtime.   Taking   estradiol (ESTRACE) 0.1 MG/GM vaginal cream Place 2 g vaginally 2 (two) times a  week.   Taking   Evolocumab  (REPATHA  SURECLICK) 140 MG/ML SOAJ INJECT 1 ML INTO THE SKIN EVERY 14 (FOURTEEN) DAYS. 6 mL 1 Taking   famotidine  (PEPCID ) 20 MG tablet Take 20 mg by mouth daily as needed for heartburn or indigestion.   Taking As Needed   hydrochlorothiazide  (HYDRODIURIL ) 25 MG tablet Take 25 mg by mouth in the morning.   Taking   ondansetron  (ZOFRAN ) 4 MG tablet Take 4 mg by mouth every 8 (eight) hours as needed for nausea or vomiting.   Taking As  Needed   pantoprazole (PROTONIX) 40 MG tablet 40 mg 2 (two) times daily.   Taking   Probiotic Product (PROBIOTIC DAILY PO) Take 1 capsule by mouth in the morning.   Taking   ramipril  (ALTACE ) 10 MG capsule Take 20 mg by mouth in the morning.   Taking   Triamcinolone  Acetonide (NASACORT  ALLERGY 24HR NA) Place 2 sprays into the nose in the morning and at bedtime.    Taking   Allergies  Allergen Reactions   Crestor  [Rosuvastatin ]     Other reaction(s): Muscle Pain Extreme muscle/joint pain   Atorvastatin Calcium     Lipitor [Atorvastatin] Other (See Comments)    Muscle pain    Percocet [Oxycodone-Acetaminophen ] Other (See Comments)    hallucinations   Sulfa Antibiotics Hives   Alendronate Nausea Only    Social History   Tobacco Use   Smoking status: Never   Smokeless tobacco: Never  Substance Use Topics   Alcohol use: Yes    Comment: Occ.    Family History  Problem Relation Age of Onset   Heart disease Mother    Heart disease Father    Heart disease Sister    Breast cancer Neg Hx      Review of Systems  Constitutional:  Negative for chills and fever.  Respiratory:  Negative for cough and shortness of breath.   Cardiovascular:  Negative for chest pain.  Gastrointestinal:  Negative for nausea and vomiting.  Musculoskeletal:  Positive for arthralgias.     Objective:  Physical Exam Left Knee: No erythema, warmth, or infection AROM 0-120 Tender to palpation about the medial joint line  Vital signs in last 24 hours:    Labs:   Estimated body mass index is 32.44 kg/m as calculated from the following:   Height as of 12/29/23: 5' 6 (1.676 m).   Weight as of 12/29/23: 91.2 kg.   Imaging Review Plain radiographs demonstrate severe degenerative joint disease of the left knee(s). The overall alignment isneutral. The bone quality appears to be adequate for age and reported activity level.      Assessment/Plan:  End stage arthritis, left knee   The patient  history, physical examination, clinical judgment of the provider and imaging studies are consistent with end stage degenerative joint disease of the left knee(s) and total knee arthroplasty is deemed medically necessary. The treatment options including medical management, injection therapy arthroscopy and arthroplasty were discussed at length. The risks and benefits of total knee arthroplasty were presented and reviewed. The risks due to aseptic loosening, infection, stiffness, patella tracking problems, thromboembolic complications and other imponderables were discussed. The patient acknowledged the explanation, agreed to proceed with the plan and consent was signed. Patient is being admitted for inpatient treatment for surgery, pain control, PT, OT, prophylactic antibiotics, VTE prophylaxis, progressive ambulation and ADL's and discharge planning. The patient is planning to be discharged home.     Patient's anticipated LOS is less than 2 midnights,  meeting these requirements: - Younger than 57 - Lives within 1 hour of care - Has a competent adult at home to recover with post-op recover - NO history of  - Chronic pain requiring opiods  - Diabetes  - Coronary Artery Disease  - Heart failure  - Heart attack  - Stroke  - DVT/VTE  - Cardiac arrhythmia  - Respiratory Failure/COPD  - Renal failure  - Anemia  - Advanced Liver disease  Rosina Calin, PA-C Orthopedic Surgery [facility] [place] [phone]

## 2024-01-05 ENCOUNTER — Encounter (HOSPITAL_COMMUNITY): Payer: Self-pay | Admitting: Orthopedic Surgery

## 2024-01-05 ENCOUNTER — Observation Stay (HOSPITAL_COMMUNITY)
Admission: RE | Admit: 2024-01-05 | Discharge: 2024-01-06 | Disposition: A | Discharge status: Home / Self Care | Attending: Orthopedic Surgery | Admitting: Orthopedic Surgery

## 2024-01-05 ENCOUNTER — Other Ambulatory Visit: Payer: Self-pay

## 2024-01-05 ENCOUNTER — Ambulatory Visit (HOSPITAL_COMMUNITY): Payer: Self-pay | Admitting: Physician Assistant

## 2024-01-05 ENCOUNTER — Ambulatory Visit (HOSPITAL_COMMUNITY): Payer: Self-pay | Admitting: Anesthesiology

## 2024-01-05 ENCOUNTER — Encounter (HOSPITAL_COMMUNITY): Admission: RE | Disposition: A | Payer: Self-pay | Source: Home / Self Care | Attending: Orthopedic Surgery

## 2024-01-05 DIAGNOSIS — Z96651 Presence of right artificial knee joint: Secondary | ICD-10-CM | POA: Diagnosis not present

## 2024-01-05 DIAGNOSIS — M1712 Unilateral primary osteoarthritis, left knee: Secondary | ICD-10-CM

## 2024-01-05 DIAGNOSIS — I10 Essential (primary) hypertension: Secondary | ICD-10-CM | POA: Insufficient documentation

## 2024-01-05 DIAGNOSIS — M25562 Pain in left knee: Secondary | ICD-10-CM | POA: Diagnosis present

## 2024-01-05 DIAGNOSIS — Z79899 Other long term (current) drug therapy: Secondary | ICD-10-CM | POA: Diagnosis not present

## 2024-01-05 DIAGNOSIS — Z96652 Presence of left artificial knee joint: Principal | ICD-10-CM

## 2024-01-05 HISTORY — PX: TOTAL KNEE ARTHROPLASTY: SHX125

## 2024-01-05 SURGERY — ARTHROPLASTY, KNEE, TOTAL
Anesthesia: Spinal | Site: Knee | Laterality: Left

## 2024-01-05 MED ORDER — 0.9 % SODIUM CHLORIDE (POUR BTL) OPTIME
TOPICAL | Status: DC | PRN
Start: 2024-01-05 — End: 2024-01-05
  Administered 2024-01-05: 1000 mL

## 2024-01-05 MED ORDER — FENTANYL CITRATE (PF) 50 MCG/ML IJ SOSY
50.0000 ug | PREFILLED_SYRINGE | INTRAMUSCULAR | Status: DC | PRN
  Administered 2024-01-05: 50 ug via INTRAVENOUS
  Filled 2024-01-05: qty 2

## 2024-01-05 MED ORDER — FAMOTIDINE 20 MG PO TABS: 20.0000 mg | ORAL_TABLET | Freq: Every day | ORAL | Status: DC | PRN

## 2024-01-05 MED ORDER — PHENYLEPHRINE HCL-NACL 20-0.9 MG/250ML-% IV SOLN
INTRAVENOUS | Status: DC | PRN
  Administered 2024-01-05: 50 ug/min via INTRAVENOUS

## 2024-01-05 MED ORDER — PROPOFOL 500 MG/50ML IV EMUL
INTRAVENOUS | Status: DC | PRN
Start: 2024-01-05 — End: 2024-01-05
  Administered 2024-01-05: 100 ug/kg/min via INTRAVENOUS

## 2024-01-05 MED ORDER — PROPOFOL 1000 MG/100ML IV EMUL
INTRAVENOUS | Status: AC
Start: 2024-01-05 — End: 2024-01-05
  Filled 2024-01-05: qty 100

## 2024-01-05 MED ORDER — METHOCARBAMOL 1000 MG/10ML IJ SOLN
500.0000 mg | Freq: Four times a day (QID) | INTRAMUSCULAR | Status: DC | PRN
  Administered 2024-01-06: 500 mg via INTRAVENOUS
  Filled 2024-01-05: qty 10

## 2024-01-05 MED ORDER — FENTANYL CITRATE (PF) 100 MCG/2ML IJ SOLN
INTRAMUSCULAR | Status: AC
  Filled 2024-01-05: qty 2

## 2024-01-05 MED ORDER — TRANEXAMIC ACID-NACL 1000-0.7 MG/100ML-% IV SOLN
INTRAVENOUS | Status: DC | PRN
  Administered 2024-01-05: 1000 mg via INTRAVENOUS

## 2024-01-05 MED ORDER — BUPIVACAINE-EPINEPHRINE (PF) 0.25% -1:200000 IJ SOLN
INTRAMUSCULAR | Status: AC
  Filled 2024-01-05: qty 30

## 2024-01-05 MED ORDER — TRANEXAMIC ACID-NACL 1000-0.7 MG/100ML-% IV SOLN
1000.0000 mg | INTRAVENOUS | Status: DC
  Filled 2024-01-05: qty 100

## 2024-01-05 MED ORDER — ASPIRIN 81 MG PO CHEW
81.0000 mg | CHEWABLE_TABLET | Freq: Two times a day (BID) | ORAL | Status: DC
  Administered 2024-01-05 – 2024-01-06 (×2): 81 mg via ORAL
  Filled 2024-01-05 (×2): qty 1

## 2024-01-05 MED ORDER — MENTHOL 3 MG MT LOZG: 1.0000 | LOZENGE | OROMUCOSAL | Status: DC | PRN

## 2024-01-05 MED ORDER — SODIUM CHLORIDE (PF) 0.9 % IJ SOLN
INTRAMUSCULAR | Status: AC
  Filled 2024-01-05: qty 50

## 2024-01-05 MED ORDER — ONDANSETRON HCL 4 MG/2ML IJ SOLN
INTRAMUSCULAR | Status: AC
Start: 2024-01-05 — End: 2024-01-05
  Filled 2024-01-05: qty 2

## 2024-01-05 MED ORDER — ACETAMINOPHEN 500 MG PO TABS
1000.0000 mg | ORAL_TABLET | Freq: Four times a day (QID) | ORAL | Status: DC
  Administered 2024-01-05 – 2024-01-06 (×4): 1000 mg via ORAL
  Filled 2024-01-05 (×4): qty 2

## 2024-01-05 MED ORDER — LACTATED RINGERS IV SOLN: INTRAVENOUS | Status: DC

## 2024-01-05 MED ORDER — METHOCARBAMOL 500 MG PO TABS
500.0000 mg | ORAL_TABLET | Freq: Four times a day (QID) | ORAL | Status: DC | PRN
  Administered 2024-01-06: 500 mg via ORAL
  Filled 2024-01-05: qty 1

## 2024-01-05 MED ORDER — DEXAMETHASONE SOD PHOSPHATE PF 10 MG/ML IJ SOLN: 8.0000 mg | Freq: Once | INTRAMUSCULAR | Status: DC

## 2024-01-05 MED ORDER — CHLORHEXIDINE GLUCONATE 0.12 % MT SOLN
15.0000 mL | Freq: Once | OROMUCOSAL | Status: AC
  Administered 2024-01-05: 15 mL via OROMUCOSAL

## 2024-01-05 MED ORDER — LACTATED RINGERS IV SOLN: INTRAVENOUS | Status: DC | PRN

## 2024-01-05 MED ORDER — DEXAMETHASONE SOD PHOSPHATE PF 10 MG/ML IJ SOLN
10.0000 mg | Freq: Once | INTRAMUSCULAR | Status: AC
  Administered 2024-01-06: 10 mg via INTRAVENOUS

## 2024-01-05 MED ORDER — RAMIPRIL 5 MG PO CAPS
20.0000 mg | ORAL_CAPSULE | Freq: Every morning | ORAL | Status: DC
  Administered 2024-01-06: 20 mg via ORAL
  Filled 2024-01-05: qty 4

## 2024-01-05 MED ORDER — CEFAZOLIN SODIUM-DEXTROSE 2-4 GM/100ML-% IV SOLN
2.0000 g | INTRAVENOUS | Status: AC
  Administered 2024-01-05: 2 g via INTRAVENOUS
  Filled 2024-01-05: qty 100

## 2024-01-05 MED ORDER — KETOROLAC TROMETHAMINE 30 MG/ML IJ SOLN
INTRAMUSCULAR | Status: AC
  Filled 2024-01-05: qty 1

## 2024-01-05 MED ORDER — SODIUM CHLORIDE (PF) 0.9 % IJ SOLN
INTRAMUSCULAR | Status: DC | PRN
  Administered 2024-01-05: 61 mL

## 2024-01-05 MED ORDER — DEXAMETHASONE SOD PHOSPHATE PF 10 MG/ML IJ SOLN
INTRAMUSCULAR | Status: DC | PRN
  Administered 2024-01-05: 10 mg via INTRAVENOUS

## 2024-01-05 MED ORDER — SENNA 8.6 MG PO TABS
2.0000 | ORAL_TABLET | Freq: Every day | ORAL | Status: DC
  Administered 2024-01-05: 17.2 mg via ORAL
  Filled 2024-01-05: qty 2

## 2024-01-05 MED ORDER — AMLODIPINE BESYLATE 10 MG PO TABS
10.0000 mg | ORAL_TABLET | Freq: Every day | ORAL | Status: DC
  Administered 2024-01-05: 10 mg via ORAL
  Filled 2024-01-05 (×2): qty 1

## 2024-01-05 MED ORDER — ONDANSETRON HCL 4 MG PO TABS
4.0000 mg | ORAL_TABLET | Freq: Four times a day (QID) | ORAL | Status: DC | PRN
  Administered 2024-01-05: 4 mg via ORAL
  Filled 2024-01-05: qty 1

## 2024-01-05 MED ORDER — METOCLOPRAMIDE HCL 5 MG PO TABS: 5.0000 mg | ORAL_TABLET | Freq: Three times a day (TID) | ORAL | Status: DC | PRN

## 2024-01-05 MED ORDER — ONDANSETRON HCL 4 MG/2ML IJ SOLN
4.0000 mg | Freq: Four times a day (QID) | INTRAMUSCULAR | Status: DC | PRN
  Administered 2024-01-06: 4 mg via INTRAVENOUS
  Filled 2024-01-05: qty 2

## 2024-01-05 MED ORDER — FENTANYL CITRATE (PF) 50 MCG/ML IJ SOSY: 25.0000 ug | PREFILLED_SYRINGE | INTRAMUSCULAR | Status: DC | PRN

## 2024-01-05 MED ORDER — ORAL CARE MOUTH RINSE: 15.0000 mL | Freq: Once | OROMUCOSAL | Status: AC

## 2024-01-05 MED ORDER — PHENOL 1.4 % MT LIQD
1.0000 | OROMUCOSAL | Status: DC | PRN
Start: 2024-01-05 — End: 2024-01-06

## 2024-01-05 MED ORDER — LIDOCAINE HCL (PF) 2 % IJ SOLN
INTRAMUSCULAR | Status: DC | PRN
  Administered 2024-01-05: 100 mg via INTRADERMAL

## 2024-01-05 MED ORDER — PROPOFOL 10 MG/ML IV BOLUS
INTRAVENOUS | Status: AC
Start: 2024-01-05 — End: 2024-01-05
  Filled 2024-01-05: qty 20

## 2024-01-05 MED ORDER — BISACODYL 10 MG RE SUPP: 10.0000 mg | Freq: Every day | RECTAL | Status: DC | PRN

## 2024-01-05 MED ORDER — METOCLOPRAMIDE HCL 5 MG/ML IJ SOLN: 5.0000 mg | Freq: Three times a day (TID) | INTRAMUSCULAR | Status: DC | PRN

## 2024-01-05 MED ORDER — PHENYLEPHRINE 80 MCG/ML (10ML) SYRINGE FOR IV PUSH (FOR BLOOD PRESSURE SUPPORT)
PREFILLED_SYRINGE | INTRAVENOUS | Status: DC | PRN
  Administered 2024-01-05: 80 ug via INTRAVENOUS

## 2024-01-05 MED ORDER — TRAMADOL HCL 50 MG PO TABS
50.0000 mg | ORAL_TABLET | Freq: Four times a day (QID) | ORAL | Status: DC
  Administered 2024-01-05 – 2024-01-06 (×4): 50 mg via ORAL
  Filled 2024-01-05 (×4): qty 1

## 2024-01-05 MED ORDER — POLYETHYLENE GLYCOL 3350 17 G PO PACK
17.0000 g | PACK | Freq: Two times a day (BID) | ORAL | Status: DC
  Administered 2024-01-05 – 2024-01-06 (×2): 17 g via ORAL
  Filled 2024-01-05 (×2): qty 1

## 2024-01-05 MED ORDER — MIDAZOLAM HCL (PF) 2 MG/2ML IJ SOLN: 1.0000 mg | INTRAMUSCULAR | Status: DC | PRN

## 2024-01-05 MED ORDER — TRANEXAMIC ACID-NACL 1000-0.7 MG/100ML-% IV SOLN
1000.0000 mg | Freq: Once | INTRAVENOUS | Status: AC
  Administered 2024-01-05: 1000 mg via INTRAVENOUS
  Filled 2024-01-05: qty 100

## 2024-01-05 MED ORDER — HYDROCHLOROTHIAZIDE 25 MG PO TABS
25.0000 mg | ORAL_TABLET | Freq: Every morning | ORAL | Status: DC
  Administered 2024-01-06: 25 mg via ORAL
  Filled 2024-01-05: qty 1

## 2024-01-05 MED ORDER — DIPHENHYDRAMINE HCL 12.5 MG/5ML PO ELIX: 12.5000 mg | ORAL_SOLUTION | ORAL | Status: DC | PRN

## 2024-01-05 MED ORDER — ONDANSETRON HCL 4 MG/2ML IJ SOLN
INTRAMUSCULAR | Status: DC | PRN
  Administered 2024-01-05: 4 mg via INTRAVENOUS

## 2024-01-05 MED ORDER — FENTANYL CITRATE (PF) 100 MCG/2ML IJ SOLN
INTRAMUSCULAR | Status: DC | PRN
  Administered 2024-01-05 (×2): 50 ug via INTRAVENOUS

## 2024-01-05 MED ORDER — BUPIVACAINE-EPINEPHRINE (PF) 0.5% -1:200000 IJ SOLN
INTRAMUSCULAR | Status: DC | PRN
  Administered 2024-01-05: 20 mL via PERINEURAL

## 2024-01-05 MED ORDER — POVIDONE-IODINE 10 % EX SWAB
2.0000 | Freq: Once | CUTANEOUS | Status: AC
  Administered 2024-01-05: 2 via TOPICAL

## 2024-01-05 MED ORDER — PANTOPRAZOLE SODIUM 40 MG PO TBEC
40.0000 mg | DELAYED_RELEASE_TABLET | Freq: Two times a day (BID) | ORAL | Status: DC
  Administered 2024-01-05 – 2024-01-06 (×2): 40 mg via ORAL
  Filled 2024-01-05 (×2): qty 1

## 2024-01-05 MED ORDER — SODIUM CHLORIDE 0.9 % IV SOLN: INTRAVENOUS | Status: DC

## 2024-01-05 MED ORDER — HYDROMORPHONE HCL 2 MG PO TABS
2.0000 mg | ORAL_TABLET | ORAL | Status: DC | PRN
  Administered 2024-01-05: 2 mg via ORAL
  Filled 2024-01-05 (×2): qty 1

## 2024-01-05 MED ORDER — ACETAMINOPHEN 325 MG PO TABS: 325.0000 mg | ORAL_TABLET | Freq: Four times a day (QID) | ORAL | Status: DC | PRN

## 2024-01-05 MED ORDER — CEFAZOLIN SODIUM-DEXTROSE 2-4 GM/100ML-% IV SOLN
2.0000 g | Freq: Four times a day (QID) | INTRAVENOUS | Status: AC
  Administered 2024-01-05 – 2024-01-06 (×2): 2 g via INTRAVENOUS
  Filled 2024-01-05 (×2): qty 100

## 2024-01-05 MED ORDER — SODIUM CHLORIDE 0.9 % IR SOLN
Status: DC | PRN
  Administered 2024-01-05: 1000 mL

## 2024-01-05 SURGICAL SUPPLY — 46 items
ATTUNE MED ANAT PAT 35 KNEE (Knees) IMPLANT
BAG COUNTER SPONGE SURGICOUNT (BAG) IMPLANT
BAG ZIPLOCK 12X15 (MISCELLANEOUS) ×1 IMPLANT
BASEPLATE TIB CMT FB PCKT SZ4 (Stem) IMPLANT
BLADE SAW SGTL 11.0X1.19X90.0M (BLADE) IMPLANT
BLADE SAW SGTL 13.0X1.19X90.0M (BLADE) ×1 IMPLANT
BNDG ELASTIC 6INX 5YD STR LF (GAUZE/BANDAGES/DRESSINGS) ×1 IMPLANT
BOWL SMART MIX CTS (DISPOSABLE) ×1 IMPLANT
CEMENT HV SMART SET (Cement) ×2 IMPLANT
COMPONENT FEM CMT ATTN NRS 4LT (Joint) IMPLANT
COVER SURGICAL LIGHT HANDLE (MISCELLANEOUS) ×1 IMPLANT
CUFF TRNQT CYL 34X4.125X (TOURNIQUET CUFF) ×1 IMPLANT
DERMABOND ADVANCED .7 DNX12 (GAUZE/BANDAGES/DRESSINGS) ×1 IMPLANT
DRAPE U-SHAPE 47X51 STRL (DRAPES) ×1 IMPLANT
DRESSING AQUACEL AG SP 3.5X10 (GAUZE/BANDAGES/DRESSINGS) ×1 IMPLANT
DURAPREP 26ML APPLICATOR (WOUND CARE) ×2 IMPLANT
ELECT REM PT RETURN 15FT ADLT (MISCELLANEOUS) ×1 IMPLANT
GLOVE BIO SURGEON STRL SZ 6 (GLOVE) ×1 IMPLANT
GLOVE BIOGEL PI IND STRL 6.5 (GLOVE) ×1 IMPLANT
GLOVE BIOGEL PI IND STRL 7.5 (GLOVE) ×1 IMPLANT
GLOVE ORTHO TXT STRL SZ7.5 (GLOVE) ×2 IMPLANT
GOWN STRL REUS W/ TWL LRG LVL3 (GOWN DISPOSABLE) ×2 IMPLANT
HOLDER FOLEY CATH W/STRAP (MISCELLANEOUS) IMPLANT
INSERT MED ATTUNE KNEE 4X6 LT (Insert) IMPLANT
KIT TURNOVER KIT A (KITS) ×1 IMPLANT
MANIFOLD NEPTUNE II (INSTRUMENTS) ×1 IMPLANT
NDL SAFETY ECLIPSE 18X1.5 (NEEDLE) IMPLANT
NS IRRIG 1000ML POUR BTL (IV SOLUTION) ×1 IMPLANT
PACK TOTAL KNEE CUSTOM (KITS) ×1 IMPLANT
PENCIL SMOKE EVACUATOR (MISCELLANEOUS) ×1 IMPLANT
PIN FIX SIGMA LCS THRD HI (PIN) IMPLANT
PROTECTOR NERVE ULNAR (MISCELLANEOUS) ×1 IMPLANT
SET HNDPC FAN SPRY TIP SCT (DISPOSABLE) ×1 IMPLANT
SET PAD KNEE POSITIONER (MISCELLANEOUS) ×1 IMPLANT
SPIKE FLUID TRANSFER (MISCELLANEOUS) ×2 IMPLANT
SUT MNCRL AB 4-0 PS2 18 (SUTURE) ×1 IMPLANT
SUT STRATAFIX PDS+ 0 24IN (SUTURE) ×1 IMPLANT
SUT VIC AB 1 CT1 36 (SUTURE) ×1 IMPLANT
SUT VIC AB 2-0 CT1 TAPERPNT 27 (SUTURE) ×2 IMPLANT
SYR 3ML LL SCALE MARK (SYRINGE) ×1 IMPLANT
TOWEL GREEN STERILE FF (TOWEL DISPOSABLE) ×1 IMPLANT
TRAY FOLEY MTR SLVR 14FR STAT (SET/KITS/TRAYS/PACK) IMPLANT
TRAY FOLEY MTR SLVR 16FR STAT (SET/KITS/TRAYS/PACK) ×1 IMPLANT
TUBE SUCTION HIGH CAP CLEAR NV (SUCTIONS) ×1 IMPLANT
WATER STERILE IRR 1000ML POUR (IV SOLUTION) ×2 IMPLANT
WRAP KNEE MAXI GEL POST OP (GAUZE/BANDAGES/DRESSINGS) ×1 IMPLANT

## 2024-01-05 NOTE — Anesthesia Procedure Notes (Signed)
 Date/Time: 01/05/2024 3:00 PM  Performed by: Obadiah Reyes BROCKS, CRNAPre-anesthesia Checklist: Patient identified, Emergency Drugs available, Suction available, Patient being monitored and Timeout performed Oxygen Delivery Method: Simple face mask Induction Type: IV induction Airway Equipment and Method: Oral airway

## 2024-01-05 NOTE — Discharge Instructions (Signed)

## 2024-01-05 NOTE — Anesthesia Postprocedure Evaluation (Signed)
 Anesthesia Post Note  Patient: Inocente FALCON Valeri  Procedure(s) Performed: ARTHROPLASTY, KNEE, TOTAL (Left: Knee)     Patient location during evaluation: PACU Anesthesia Type: Spinal and Regional Level of consciousness: oriented and awake and alert Pain management: pain level controlled Vital Signs Assessment: post-procedure vital signs reviewed and stable Respiratory status: spontaneous breathing and respiratory function stable Cardiovascular status: blood pressure returned to baseline and stable Postop Assessment: no headache, no backache, no apparent nausea or vomiting, spinal receding and patient able to bend at knees Anesthetic complications: no   No notable events documented.  Last Vitals:  Vitals:   01/05/24 1800 01/05/24 1815  BP: (!) 116/99 130/74  Pulse: 86 87  Resp: 15 17  Temp:    SpO2: 96% 100%    Last Pain:  Vitals:   01/05/24 1815  PainSc: 0-No pain    LLE Motor Response: Purposeful movement (01/05/24 1815) LLE Sensation: Numbness (01/05/24 1815) RLE Motor Response: Purposeful movement (01/05/24 1815) RLE Sensation: Numbness (01/05/24 1815) L Sensory Level: L5-Outer lower leg, top of foot, great toe (01/05/24 1815) R Sensory Level: L5-Outer lower leg, top of foot, great toe (01/05/24 1815)  Airen Stiehl,W. EDMOND

## 2024-01-05 NOTE — Progress Notes (Signed)
 [facility]  PROCEDURAL EXPEDITER PROGRESS NOTE  Patient Name: Tracey Rosales  DOB:Mar 20, 1950 Date of Admission: (Not on file)  Date of Assessment:01/05/24   -------------------------------------------------------------------------------------------------------------------   Brief clinical summary: Pt having Knee surgery today  Orders in place:  Yes   Communication with surgical team if no orders: n/a  Labs, test, and orders reviewed: n/a  Requires surgical clearance:  Yes  What type of clearance: Cardio on chart  Clearance received: 10/13/2023 from Dr Lynford with echo results  Barriers noted:n/a   Intervention provided by [facility] team: n/a  Barrier resolved:  yes   -------------------------------------------------------------------------------------------------------------------  [facility] Expediter, Ronal DELENA Bald Please contact us  directly via secure chat (search for [facility]) or by calling us  at [phone] [facility]).

## 2024-01-05 NOTE — Interval H&P Note (Signed)
 History and Physical Interval Note:  01/05/2024 1:59 PM  Tracey Rosales  has presented today for surgery, with the diagnosis of Left knee osteoarthritis.  The various methods of treatment have been discussed with the patient and family. After consideration of risks, benefits and other options for treatment, the patient has consented to  Procedure(s): ARTHROPLASTY, KNEE, TOTAL (Left) as a surgical intervention.  The patient's history has been reviewed, patient examined, no change in status, stable for surgery.  I have reviewed the patient's chart and labs.  Questions were answered to the patient's satisfaction.     [name]

## 2024-01-05 NOTE — Anesthesia Procedure Notes (Signed)
 Anesthesia Regional Block: Adductor canal block   Pre-Anesthetic Checklist: , timeout performed,  Correct Patient, Correct Site, Correct Laterality,  Correct Procedure, Correct Position, site marked,  Risks and benefits discussed,  Pre-op evaluation,  At surgeon's request and post-op pain management  Laterality: Left  Prep: Maximum Sterile Barrier Precautions used, chloraprep       Needles:  Injection technique: Single-shot  Needle Type: Echogenic Stimulator Needle     Needle Length: 9cm  Needle Gauge: 21     Additional Needles:   Procedures:,,,, ultrasound used (permanent image in chart),,    Narrative:  Start time: 01/05/2024 1:37 PM End time: 01/05/2024 1:47 PM Injection made incrementally with aspirations every 5 mL.  Performed by: Personally  Anesthesiologist: Epifanio Fallow, MD

## 2024-01-05 NOTE — Op Note (Signed)
 NAME:  Tracey Rosales                      MEDICAL RECORD NO.:  [id number]                             FACILITY:  [facility]      PHYSICIAN:  Donnice BIRCH. Ernie, M.D.  DATE OF BIRTH:  05-May-1950      DATE OF PROCEDURE:  01/05/2024                                     OPERATIVE REPORT         PREOPERATIVE DIAGNOSIS:  Left knee osteoarthritis.      POSTOPERATIVE DIAGNOSIS:  Left knee osteoarthritis.      FINDINGS:  The patient was noted to have complete loss of cartilage and   bone-on-bone arthritis with associated osteophytes in all three compartments of   the knee with a significant synovitis and associated effusion.  The patient had failed months of conservative treatment including medications, injection therapy, activity modification.     PROCEDURE:  Left total knee replacement.      COMPONENTS USED:  DePuy Attune FB CR MS knee   system, a size 4N femur, 4 tibia, size 6 mm CR MS AOX insert, and 35 anatomic patellar   button.      SURGEON:  Donnice BIRCH. Ernie, M.D.      ASSISTANT:  Rosina Calin, PA-C.      ANESTHESIA:  Regional and Spinal.      SPECIMENS:  None.      COMPLICATION:  None.      DRAINS:  None.  EBL: <400 cc      TOURNIQUET TIME:  No tourniquet used     The patient was stable to the recovery room.      INDICATION FOR PROCEDURE:  Tracey Rosales is a 73 y.o. female patient of   mine.  The patient had been seen, evaluated, and treated for months conservatively in the   office with medication, activity modification, and injections.  The patient had   radiographic changes of bone-on-bone arthritis with endplate sclerosis and osteophytes noted.  Based on the radiographic changes and failed conservative measures, the patient   decided to proceed with definitive treatment, total knee replacement.  Risks of infection, DVT, component failure, need for revision surgery, neurovascular injury were reviewed in the office setting.  The postop course was reviewed  stressing the efforts to maximize post-operative satisfaction and function.  Consent was obtained for benefit of pain   relief.      PROCEDURE IN DETAIL:  The patient was brought to the operative theater.   Once adequate anesthesia, preoperative antibiotics, 2 gm of Ancef ,1 gm of Tranexamic Acid , and 10 mg of Decadron  administered, the patient was positioned supine.  The  left lower extremity was prepped and draped in sterile fashion.  A time-   out was performed identifying the patient, planned procedure, and the appropriate extremity.      The left lower extremity was placed in the [facility] leg holder.  A midline incision was   made followed by median parapatellar arthrotomy.  Following initial   exposure, attention was first directed to the patella.  Precut   measurement was noted to be 20 mm.  I resected down to 13  mm and used a   35 anatomic patellar button to restore patellar height as well as cover the cut surface.      The lug holes were drilled and a metal shim was placed to protect the   patella from retractors and saw blade during the procedure.      At this point, attention was now directed to the femur.  The femoral   canal was opened with a drill, irrigated to try to prevent fat emboli.  An   intramedullary rod was passed at 3 degrees valgus, 9 mm of bone was   resected off the distal femur.  Following this resection, the tibia was   subluxated anteriorly.  Using the extramedullary guide, 2 mm of bone was resected off   the proximal medial tibia.  We confirmed the gap would be   stable medially and laterally with a size 5 spacer block as well as confirmed that the tibial cut was perpendicular in the coronal plane, checking with an alignment rod.      Once this was done, I sized the femur to be a size 4 in the anterior-   posterior dimension, chose a narrow component based on medial and   lateral dimension.  The size 4 rotation block was then pinned in   position anterior  referenced using the C-clamp to set rotation.  The   anterior, posterior, and  chamfer cuts were made without difficulty nor   notching making certain that I was along the anterior cortex to help   with flexion gap stability.      The final box cut was made off the lateral aspect of distal femur.      At this point, the tibia was sized to be a size 4.  The size 4 tray was   then pinned in position through the medial third of the tubercle,   drilled, and keel punched.  Trial reduction was now carried with a 4 femur,  4 tibia, a size 6 mm CR insert, and the 35 anatomic patella botton.  The knee was brought to full extension with good flexion stability with the patella   tracking through the trochlea without application of pressure.  Given   all these findings the trial components removed.  Final components were   opened and cement was mixed.  The knee was irrigated with normal saline solution and pulse lavage.  The synovial lining was   then injected with 30 cc of 0.25% Marcaine  with epinephrine , 1 cc of Toradol  and 30 cc of NS for a total of 61 cc.     Final implants were then cemented onto cleaned and dried cut surfaces of bone with the knee brought to extension with a size 6 mm CR trial insert.      Once the cement had fully cured, excess cement was removed   throughout the knee.  I confirmed that I was satisfied with the range of   motion and stability, and the final size 6 mm CR MS AOX insert was chosen.  It was   placed into the knee.     No significant hemostasis was required at this point in the case.  The extensor mechanism was then reapproximated using #1 Vicryl and #1 Stratafix sutures with the knee   in flexion.  The   remaining wound was closed with 2-0 Vicryl and running 4-0 Monocryl.   The knee was cleaned, dried, dressed sterilely using Dermabond and   Aquacel dressing.  The patient was then   brought to recovery room in stable condition, tolerating the procedure   well.    Please note that Physician Assistant, Rosina Calin, PA-C was present for the entirety of the case, and was utilized for pre-operative positioning, peri-operative retractor management, general facilitation of the procedure and for primary wound closure at the end of the case.              Donnice CORDOBA Ernie, M.D.    01/05/2024 2:00 PM

## 2024-01-05 NOTE — Transfer of Care (Signed)
 Immediate Anesthesia Transfer of Care Note  Patient: Tracey Rosales  Procedure(s) Performed: ARTHROPLASTY, KNEE, TOTAL (Left: Knee)  Patient Location: PACU  Anesthesia Type:MAC, Regional, and Spinal  Level of Consciousness: awake, alert , and oriented  Airway & Oxygen Therapy: Patient Spontanous Breathing and Patient connected to nasal cannula oxygen  Post-op Assessment: Report given to RN and Post -op Vital signs reviewed and stable  Post vital signs: Reviewed and stable  Last Vitals:  Vitals Value Taken Time  BP 123/75 01/05/24 16:53  Temp    Pulse 82 01/05/24 16:55  Resp 19 01/05/24 16:55  SpO2 100 % 01/05/24 16:55  Vitals shown include unfiled device data.  Last Pain:  Vitals:   01/05/24 1355  PainSc: 0-No pain      Patients Stated Pain Goal: 5 (01/05/24 1300)  Complications: No notable events documented.

## 2024-01-06 ENCOUNTER — Encounter (HOSPITAL_COMMUNITY): Payer: Self-pay | Admitting: Orthopedic Surgery

## 2024-01-06 ENCOUNTER — Other Ambulatory Visit (HOSPITAL_COMMUNITY): Payer: Self-pay

## 2024-01-06 DIAGNOSIS — M1712 Unilateral primary osteoarthritis, left knee: Secondary | ICD-10-CM | POA: Diagnosis not present

## 2024-01-06 LAB — BASIC METABOLIC PANEL WITH GFR
Anion gap: 14 (ref 5–15)
BUN: 10 mg/dL (ref 8–23)
CO2: 22 mmol/L (ref 22–32)
Calcium: 9.1 mg/dL (ref 8.9–10.3)
Chloride: 104 mmol/L (ref 98–111)
Creatinine, Ser: 0.57 mg/dL (ref 0.44–1.00)
GFR, Estimated: >60 mL/min (ref >60)
Glucose, Bld: 189 mg/dL — ABNORMAL HIGH (ref 70–99)
Potassium: 4 mmol/L (ref 3.5–5.1)
Sodium: 139 mmol/L (ref 135–145)

## 2024-01-06 LAB — CBC
HCT: 37.2 % (ref 36.0–46.0)
Hemoglobin: 11.7 g/dL — ABNORMAL LOW (ref 12.0–15.0)
MCH: 27.9 pg (ref 26.0–34.0)
MCHC: 31.5 g/dL (ref 30.0–36.0)
MCV: 88.8 fL (ref 80.0–100.0)
Platelets: 321 K/uL (ref 150–400)
RBC: 4.19 MIL/uL (ref 3.87–5.11)
RDW: 13.2 % (ref 11.5–15.5)
WBC: 16.4 K/uL — ABNORMAL HIGH (ref 4.0–10.5)
nRBC: 0 % (ref 0.0–0.2)

## 2024-01-06 MED ORDER — POLYETHYLENE GLYCOL 3350 17 GM/SCOOP PO POWD
17.0000 g | Freq: Two times a day (BID) | ORAL | 0 refills | Status: AC
  Filled 2024-01-06: qty 238, 7d supply, fill #0

## 2024-01-06 MED ORDER — ASPIRIN 81 MG PO CHEW
81.0000 mg | CHEWABLE_TABLET | Freq: Two times a day (BID) | ORAL | 0 refills | Status: AC
  Filled 2024-01-06: qty 56, 28d supply, fill #0

## 2024-01-06 MED ORDER — HYDROMORPHONE HCL 2 MG PO TABS
2.0000 mg | ORAL_TABLET | ORAL | 0 refills | Status: AC | PRN
  Filled 2024-01-06: qty 42, 7d supply, fill #0

## 2024-01-06 MED ORDER — METHOCARBAMOL 500 MG PO TABS
500.0000 mg | ORAL_TABLET | Freq: Four times a day (QID) | ORAL | 0 refills | Status: AC | PRN
  Filled 2024-01-06: qty 40, 10d supply, fill #0

## 2024-01-06 MED ORDER — SENNA 8.6 MG PO TABS
2.0000 | ORAL_TABLET | Freq: Every day | ORAL | 0 refills | Status: AC
  Filled 2024-01-06: qty 28, 14d supply, fill #0

## 2024-01-06 MED ORDER — ONDANSETRON HCL 4 MG PO TABS
4.0000 mg | ORAL_TABLET | Freq: Four times a day (QID) | ORAL | 0 refills | Status: AC | PRN
  Filled 2024-01-06: qty 18, 5d supply, fill #0

## 2024-01-06 NOTE — Plan of Care (Signed)
  Problem: Activity: Goal: Risk for activity intolerance will decrease Outcome: Progressing   Problem: Coping: Goal: Level of anxiety will decrease Outcome: Progressing   Problem: Pain Managment: Goal: General experience of comfort will improve and/or be controlled Outcome: Progressing   Problem: Safety: Goal: Ability to remain free from injury will improve Outcome: Progressing   Problem: Skin Integrity: Goal: Risk for impaired skin integrity will decrease Outcome: Progressing   Problem: Pain Management: Goal: Pain level will decrease with appropriate interventions Outcome: Progressing   Problem: Skin Integrity: Goal: Will show signs of wound healing Outcome: Progressing

## 2024-01-06 NOTE — Progress Notes (Signed)
 Physical Therapy Treatment Patient Details Name: Tracey Rosales MRN: [id number] DOB: Oct 03, 1950 Today's Date: 01/06/2024   History of Present Illness 73 YO female S/P LTKA on 01/05/24. PMH: RTKA, GERD, HTN,OA, PONV    PT Comments  Patient assisted to ambulate in room with RW to BR , daughter present and will assist out of the  BR. Will return to perform stair instruction.   If plan is discharge home, recommend the following: A little help with walking and/or transfers;Help with stairs or ramp for entrance;Assist for transportation;Assistance with cooking/housework;A little help with bathing/dressing/bathroom   Can travel by private vehicle        Equipment Recommendations  None recommended by PT    Recommendations for Other Services       Precautions / Restrictions Precautions Precautions: Knee;Fall Restrictions Weight Bearing Restrictions Per Provider Order: No     Mobility  Bed Mobility     General bed mobility comments: in recliner    Transfers Overall transfer level: Needs assistance Equipment used: Rolling walker (2 wheels) Transfers: Sit to/from Stand Sit to Stand: Contact guard assist           General transfer comment: cues for hand placement and LLE position    Ambulation/Gait Ambulation/Gait assistance: Contact guard assist Gait Distance (Feet): 20 Feet Assistive device: Rolling walker (2 wheels) Gait Pattern/deviations: Step-to pattern Gait velocity: decr     General Gait Details: cues for sequence   Stairs             Wheelchair Mobility     Tilt Bed    Modified Rankin (Stroke Patients Only)       Balance Overall balance assessment: Mild deficits observed, not formally tested                                          Communication Communication Communication: No apparent difficulties  Cognition Arousal: Alert Behavior During Therapy: WFL for tasks assessed/performed   PT - Cognitive impairments:  No apparent impairments                         Following commands: Intact      Cueing    Exercises    General Comments        Pertinent Vitals/Pain Pain Assessment Pain Assessment: 0-10 Pain Score: 3  Pain Location: l knee Pain Descriptors / Indicators: Discomfort Pain Intervention(s): Monitored during session    Home Living Family/patient expects to be discharged to:: Private residence Living Arrangements: Spouse/significant other Available Help at Discharge: Family Type of Home: House Home Access: Stairs to enter Entrance Stairs-Rails: Left Entrance Stairs-Number of Steps: 3   Home Layout: One level Home Equipment: Agricultural Consultant (2 wheels);Cane - single point;BSC/3in1      Prior Function            PT Goals (current goals can now be found in the care plan section) Acute Rehab PT Goals Patient Stated Goal: go home PT Goal Formulation: With patient/family Time For Goal Achievement: 01/13/24 Potential to Achieve Goals: Good Progress towards PT goals: Progressing toward goals    Frequency    7X/week      PT Plan      Co-evaluation              AM-PAC PT 6 Clicks Mobility   Outcome Measure  Help needed turning from your  back to your side while in a flat bed without using bedrails?: None Help needed moving from lying on your back to sitting on the side of a flat bed without using bedrails?: A Little Help needed moving to and from a bed to a chair (including a wheelchair)?: A Little Help needed standing up from a chair using your arms (e.g., wheelchair or bedside chair)?: A Little Help needed to walk in hospital room?: A Little Help needed climbing 3-5 steps with a railing? : A Little 6 Click Score: 19    End of Session Equipment Utilized During Treatment: Gait belt Activity Tolerance: Patient tolerated treatment well Patient left: in chair;with call bell/phone within reach;with family/visitor present Nurse Communication:  Mobility status PT Visit Diagnosis: Unsteadiness on feet (R26.81)     Time: 8993-8983 PT Time Calculation (min) (ACUTE ONLY): 10 min  Charges:    $Gait Training: 8-22 mins PT General Charges $$ ACUTE PT VISIT: 1 Visit                     Darice Potters PT Acute Rehabilitation Services Office [phone]    Potters Darice Norris 01/06/2024, 1:42 PM

## 2024-01-06 NOTE — Progress Notes (Signed)
 Physical Therapy Treatment Patient Details Name: Tracey Rosales MRN: [id number] DOB: 01/28/1951 Today's Date: 01/06/2024   History of Present Illness 73 YO female S/P LTKA on 01/05/24. PMH: RTKA, GERD, HTN,OA, PONV    PT Comments  The patient's left knee buckled slightly going up steps. Patient's daughter instructed to lightly support th thigh when patient steps up the steps  when getting into her home. Patient has met PT goals for Dc.     If plan is discharge home, recommend the following: A little help with walking and/or transfers;Help with stairs or ramp for entrance;Assist for transportation;Assistance with cooking/housework;A little help with bathing/dressing/bathroom   Can travel by private vehicle        Equipment Recommendations  None recommended by PT    Recommendations for Other Services       Precautions / Restrictions Precautions Precautions: Knee;Fall Restrictions Weight Bearing Restrictions Per Provider Order: No     Mobility  Bed Mobility     General bed mobility comments: in recliner    Transfers Overall transfer level: Needs assistance Equipment used: Rolling walker (2 wheels) Transfers: Sit to/from Stand Sit to Stand: Supervision           General transfer comment: cues for hand placement and LLE position    Ambulation/Gait Ambulation/Gait assistance: Contact guard assist Gait Distance (Feet): 50 Feet Assistive device: Rolling walker (2 wheels) Gait Pattern/deviations: Step-through pattern Gait velocity: decr     General Gait Details: cues for sequence   Stairs Stairs: Yes Stairs assistance: Min assist Stair Management: One rail Left, Forwards, With cane Number of Stairs: 2 (and 3) General stair comments: patient's L knee buckled going uop steps, family instructed and demonstrated/teach back on supporting the knee going up by standing behind patient and placing gentle pressure againt the thigh above the knee.   Wheelchair  Mobility     Tilt Bed    Modified Rankin (Stroke Patients Only)       Balance Overall balance assessment: Mild deficits observed, not formally tested                                          Communication Communication Communication: No apparent difficulties  Cognition Arousal: Alert Behavior During Therapy: WFL for tasks assessed/performed   PT - Cognitive impairments: No apparent impairments                         Following commands: Intact      Cueing    Exercises Total Joint Exercises Ankle Circles/Pumps: AROM, Both, 10 reps Quad Sets: AROM, Left, 10 reps Straight Leg Raises: AAROM, Left, 10 reps, Seated Long Arc Quad: AAROM, Left, 10 reps, Seated Knee Flexion: AROM, Left, 10 reps    General Comments        Pertinent Vitals/Pain Pain Assessment Pain Assessment: 0-10 Pain Score: 2  Pain Location: l knee Pain Descriptors / Indicators: Discomfort Pain Intervention(s): Monitored during session, Premedicated before session    Home Living                          Prior Function            PT Goals (current goals can now be found in the care plan section) Progress towards PT goals: Progressing toward goals    Frequency    7X/week  PT Plan      Co-evaluation              AM-PAC PT 6 Clicks Mobility   Outcome Measure  Help needed turning from your back to your side while in a flat bed without using bedrails?: None Help needed moving from lying on your back to sitting on the side of a flat bed without using bedrails?: None Help needed moving to and from a bed to a chair (including a wheelchair)?: A Little Help needed standing up from a chair using your arms (e.g., wheelchair or bedside chair)?: A Little Help needed to walk in hospital room?: A Little Help needed climbing 3-5 steps with a railing? : A Little 6 Click Score: 20    End of Session Equipment Utilized During Treatment: Gait  belt Activity Tolerance: Patient tolerated treatment well Patient left: in chair;with call bell/phone within reach;with family/visitor present Nurse Communication: Mobility status PT Visit Diagnosis: Unsteadiness on feet (R26.81)     Time: 8853-8799 PT Time Calculation (min) (ACUTE ONLY): 14 min  Charges:    $Gait Training: 8-22 mins PT General Charges $$ ACUTE PT VISIT: 1 Visit                     Tracey Rosales PT Acute Rehabilitation Services Office [phone]    Rosales Tracey Norris 01/06/2024, 2:01 PM

## 2024-01-06 NOTE — Evaluation (Signed)
 Physical Therapy Evaluation Patient Details Name: Tracey Rosales MRN: [id number] DOB: May 07, 1950 Today's Date: 01/06/2024  History of Present Illness  73 YO female S/P LTKA on 01/05/24. PMH: RTKA, GERD, HTN,OA, PONV  Clinical Impression   The patient reports nausea when  up to [facility], received medication.  Patient ambulated x 29' with RW with no further nausea. Tolerated well.  Instructed and teach back  HEP for TKA.   PT to return for stair instruction.   Pt admitted with above diagnosis.  Pt currently with functional limitations due to the deficits listed below (see PT Problem List). Pt will benefit from acute skilled PT to increase their independence and safety with mobility to allow discharge.         If plan is discharge home, recommend the following: A little help with walking and/or transfers;Help with stairs or ramp for entrance;Assist for transportation;Assistance with cooking/housework;A little help with bathing/dressing/bathroom   Can travel by private vehicle        Equipment Recommendations None recommended by PT  Recommendations for Other Services       Functional Status Assessment Patient has had a recent decline in their functional status and demonstrates the ability to make significant improvements in function in a reasonable and predictable amount of time.     Precautions / Restrictions Precautions Precautions: Knee;Fall Restrictions Weight Bearing Restrictions Per Provider Order: No      Mobility  Bed Mobility Overal bed mobility: Needs Assistance Bed Mobility: Supine to Sit     Supine to sit: Contact guard     General bed mobility comments: use of belt to move LLE    Transfers Overall transfer level: Needs assistance Equipment used: Rolling walker (2 wheels) Transfers: Sit to/from Stand Sit to Stand: Contact guard assist           General transfer comment: cues for hand placement and LLE position    Ambulation/Gait Ambulation/Gait  assistance: Contact guard assist Gait Distance (Feet): 60 Feet Assistive device: Rolling walker (2 wheels) Gait Pattern/deviations: Step-to pattern Gait velocity: decr     General Gait Details: cues for sequence  Stairs            Wheelchair Mobility     Tilt Bed    Modified Rankin (Stroke Patients Only)       Balance Overall balance assessment: Mild deficits observed, not formally tested                                           Pertinent Vitals/Pain Pain Assessment Pain Assessment: 0-10 Pain Score: 5  Pain Location: l knee Pain Descriptors / Indicators: Grimacing, Discomfort Pain Intervention(s): Ice applied, Premedicated before session, Monitored during session    Home Living Family/patient expects to be discharged to:: Private residence Living Arrangements: Spouse/significant other Available Help at Discharge: Family Type of Home: House Home Access: Stairs to enter Entrance Stairs-Rails: Left Entrance Stairs-Number of Steps: 3   Home Layout: One level Home Equipment: [employer] (2 wheels);Cane - single point;BSC/3in1      Prior Function Prior Level of Function : Independent/Modified Independent;Driving                     Extremity/Trunk Assessment   Upper Extremity Assessment Upper Extremity Assessment: Overall WFL for tasks assessed    Lower Extremity Assessment Lower Extremity Assessment: LLE deficits/detail LLE Deficits / Details: SLR  with MIN assist, Knee flex to ~ 0-70 LLE Sensation: WNL LLE Coordination: WNL    Cervical / Trunk Assessment Cervical / Trunk Assessment: Normal  Communication   Communication Communication: No apparent difficulties    Cognition Arousal: Alert Behavior During Therapy: WFL for tasks assessed/performed   PT - Cognitive impairments: No apparent impairments                         Following commands: Intact       Cueing       General Comments       Exercises Total Joint Exercises Ankle Circles/Pumps: AROM, Both, 10 reps Quad Sets: AROM, Left, 10 reps Straight Leg Raises: AAROM, Left, 10 reps, Seated Long Arc Quad: AAROM, Left, 10 reps, Seated with bel Knee Flexion: AROM, Left, 10 reps   Assessment/Plan    PT Assessment Patient needs continued PT services  PT Problem List Decreased strength;Decreased range of motion;Decreased activity tolerance;Decreased mobility;Pain       PT Treatment Interventions DME instruction;Gait training;Stair training;Functional mobility training;Therapeutic activities;Therapeutic exercise;Patient/family education    PT Goals (Current goals can be found in the Care Plan section)  Acute Rehab PT Goals Patient Stated Goal: go home PT Goal Formulation: With patient/family Time For Goal Achievement: 01/13/24 Potential to Achieve Goals: Good    Frequency 7X/week     Co-evaluation               AM-PAC PT 6 Clicks Mobility  Outcome Measure Help needed turning from your back to your side while in a flat bed without using bedrails?: None Help needed moving from lying on your back to sitting on the side of a flat bed without using bedrails?: A Little Help needed moving to and from a bed to a chair (including a wheelchair)?: A Little Help needed standing up from a chair using your arms (e.g., wheelchair or bedside chair)?: A Little Help needed to walk in hospital room?: A Little Help needed climbing 3-5 steps with a railing? : A Little 6 Click Score: 19    End of Session Equipment Utilized During Treatment: Gait belt Activity Tolerance: Patient tolerated treatment well Patient left: in chair;with call bell/phone within reach;with family/visitor present Nurse Communication: Mobility status PT Visit Diagnosis: Unsteadiness on feet (R26.81)    Time: 9079-9051 PT Time Calculation (min) (ACUTE ONLY): 28 min   Charges:   PT Evaluation $PT Eval Low Complexity: 1 Low PT Treatments $Gait  Training: 8-22 mins PT General Charges $$ ACUTE PT VISIT: 1 Visit         Darice Potters PT Acute Rehabilitation Services Office [phone]   Potters Darice Norris 01/06/2024, 9:53 AM

## 2024-01-06 NOTE — Progress Notes (Signed)
   Subjective: 1 Day Post-Op Procedure(s) (LRB): ARTHROPLASTY, KNEE, TOTAL (Left) Patient reports pain as mild.   Patient seen in rounds with Dr. Ernie. Patient is well, and has had no acute complaints or problems. No acute events overnight. Foley catheter removed. Patient has not been up with PT yet. She did not get nauseous with dilaudid , but did take zofran  with it.  We will start therapy today.   Objective: Vital signs in last 24 hours: Temp:  [97.6 F (36.4 C)-98.3 F (36.8 C)] 97.9 F (36.6 C) (11/26 0532) Pulse Rate:  [71-111] 71 (11/26 0532) Resp:  [13-25] 18 (11/26 0532) BP: (109-186)/(64-140) 126/80 (11/26 0532) SpO2:  [94 %-100 %] 98 % (11/26 0532) Weight:  [91.2 kg] 91.2 kg (11/25 1300)  Intake/Output from previous day:  Intake/Output Summary (Last 24 hours) at 01/06/2024 0755 Last data filed at 01/06/2024 0600 Gross per 24 hour  Intake 2455.38 ml  Output 2050 ml  Net 405.38 ml     Intake/Output this shift: No intake/output data recorded.  Labs: Recent Labs    01/06/24 0333  HGB 11.7*   Recent Labs    01/06/24 0333  WBC 16.4*  RBC 4.19  HCT 37.2  PLT 321   Recent Labs    01/06/24 0333  NA 139  K 4.0  CL 104  CO2 22  BUN 10  CREATININE 0.57  GLUCOSE 189*  CALCIUM  9.1   No results for input(s): LABPT, INR in the last 72 hours.  Exam: General - Patient is Alert and Oriented Extremity - Neurologically intact Sensation intact distally Intact pulses distally Dorsiflexion/Plantar flexion intact Dressing - dressing C/D/I Motor Function - intact, moving foot and toes well on exam.   Past Medical History:  Diagnosis Date   Anemia    low iron post last knee surgery   Arthritis    Hands, knees,   GERD (gastroesophageal reflux disease)    Heart murmur 1979   Hypertension    Hypochloremia    PONV (postoperative nausea and vomiting)     Assessment/Plan: 1 Day Post-Op Procedure(s) (LRB): ARTHROPLASTY, KNEE, TOTAL (Left) Principal  Problem:   S/P total knee arthroplasty, left  Estimated body mass index is 32.44 kg/m as calculated from the following:   Height as of this encounter: 5' 6 (1.676 m).   Weight as of this encounter: 91.2 kg. Advance diet Up with therapy D/C IV fluids   Patient's anticipated LOS is less than 2 midnights, meeting these requirements: - Younger than 96 - Lives within 1 hour of care - Has a competent adult at home to recover with post-op recover - NO history of  - Chronic pain requiring opiods  - Diabetes  - Coronary Artery Disease  - Heart failure  - Heart attack  - Stroke  - DVT/VTE  - Cardiac arrhythmia  - Respiratory Failure/COPD  - Renal failure  - Anemia  - Advanced Liver disease     DVT Prophylaxis - Aspirin  Weight bearing as tolerated.  Hgb stable at 11.7 this AM  Plan is to go Home after hospital stay. Plan for discharge today following 1-2 sessions of PT as long as they are meeting their goals. Patient is scheduled for OPPT. Follow up in the office in 2 weeks.   Rosina Calin, PA-C Orthopedic Surgery [phone] 01/06/2024, 7:55 AM

## 2024-01-06 NOTE — Care Management Obs Status (Signed)
 MEDICARE OBSERVATION STATUS NOTIFICATION   Patient Details  Name: Tracey Rosales MRN: [id number] Date of Birth: 06-14-1950   Medicare Observation Status Notification Given:  Yes    [name] A Lisle Skillman, LCSW 01/06/2024, 9:20 AM

## 2024-01-06 NOTE — TOC Transition Note (Signed)
 Transition of Care [facility]) - Discharge Note   Patient Details  Name: Tracey Rosales MRN: [id number] Date of Birth: 1951/01/19  Transition of Care [facility]) CM/SW Contact:  Heather DELENA Saltness, LCSW Phone Number: 01/06/2024, 9:52 AM   Clinical Narrative:    Pt discharging home today. OPPT prearranged at [facility] in [place]. Pt denies needing DME, reports she has walker at home. Pt's spouse and/or daughter will transport pt home upon discharge. No further TOC needs at this time.   Final next level of care: Home/Self Care Barriers to Discharge: Barriers Resolved   Patient Goals and CMS Choice Patient states their goals for this hospitalization and ongoing recovery are:: To return home        Discharge Placement  Home              Patient to be transferred to facility by: Spouse/daughter Name of family member notified: Patient and daughter Patient and family notified of of transfer: 01/06/24  Discharge Plan and Services Additional resources added to the After Visit Summary for  Follow Up                DME Arranged: N/A DME Agency: NA       HH Arranged: NA HH Agency: NA        Social Drivers of Health (SDOH) Interventions SDOH Screenings   Food Insecurity: No Food Insecurity (01/05/2024)  Housing: Low Risk  (01/05/2024)  Transportation Needs: No Transportation Needs (01/05/2024)  Utilities: Not At Risk (01/05/2024)  Financial Resource Strain: Low Risk (11/23/2023)   Received from [facility]  Physical Activity: Sufficiently Active (07/03/2022)   Received from [facility]  Social Connections: Socially Integrated (01/05/2024)  Stress: No Stress Concern Present (07/03/2022)   Received from [facility]  Tobacco Use: Low Risk  (01/05/2024)  Health Literacy: Low Risk (07/03/2022)   Received from [facility]     Readmission Risk Interventions     No data to display          Signed: Heather Saltness, MSW, LCSW Clinical Social  Worker Inpatient Care Management 01/06/2024 9:54 AM

## 2024-01-06 NOTE — Progress Notes (Signed)
 Discharge medications delivered to patient at the bedside.

## 2024-01-11 ENCOUNTER — Other Ambulatory Visit: Payer: Self-pay | Admitting: Cardiology

## 2024-01-11 DIAGNOSIS — R931 Abnormal findings on diagnostic imaging of heart and coronary circulation: Secondary | ICD-10-CM

## 2024-01-11 DIAGNOSIS — E7800 Pure hypercholesterolemia, unspecified: Secondary | ICD-10-CM

## 2024-01-11 NOTE — Discharge Summary (Signed)
 Patient ID: Tracey Rosales MRN: [id number] DOB/AGE: [birth year] 73 y.o.  Admit date: 01/05/2024 Discharge date: 01/06/2024  Admission Diagnoses:  Left knee osteoarthritis  Discharge Diagnoses:  Principal Problem:   S/P total knee arthroplasty, left   Past Medical History:  Diagnosis Date   Anemia    low iron post last knee surgery   Arthritis    Hands, knees,   GERD (gastroesophageal reflux disease)    Heart murmur 1979   Hypertension    Hypochloremia    PONV (postoperative nausea and vomiting)     Surgeries: Procedure(s): ARTHROPLASTY, KNEE, TOTAL on 01/05/2024   Consultants:   Discharged Condition: Improved  Hospital Course: ADLEY CASTELLO is an 73 y.o. female who was admitted 01/05/2024 for operative treatment ofS/P total knee arthroplasty, left. Patient has severe unremitting pain that affects sleep, daily activities, and work/hobbies. After pre-op clearance the patient was taken to the operating room on 01/05/2024 and underwent  Procedure(s): ARTHROPLASTY, KNEE, TOTAL.    Patient was given perioperative antibiotics:  Anti-infectives (From admission, onward)    Start     Dose/Rate Route Frequency Ordered Stop   01/05/24 2100  ceFAZolin  (ANCEF ) IVPB 2g/100 mL premix        2 g 200 mL/hr over 30 Minutes Intravenous Every 6 hours 01/05/24 1833 01/06/24 0915   01/05/24 1300  ceFAZolin  (ANCEF ) IVPB 2g/100 mL premix        2 g 200 mL/hr over 30 Minutes Intravenous On call to O.R. 01/05/24 1249 01/05/24 1506        Patient was given sequential compression devices, early ambulation, and chemoprophylaxis to prevent DVT. Patient worked with PT and was meeting their goals regarding safe ambulation and transfers.  Patient benefited maximally from hospital stay and there were no complications.    Recent vital signs: No data found.   Recent laboratory studies: No results for input(s): WBC, HGB, HCT, PLT, NA, K, CL, CO2, BUN, CREATININE,  GLUCOSE, INR, CALCIUM  in the last 72 hours.  Invalid input(s): PT, 2   Discharge Medications:   Allergies as of 01/06/2024       Reactions   Crestor  [rosuvastatin ]    Other reaction(s): Muscle Pain Extreme muscle/joint pain   Atorvastatin Calcium     Lipitor [atorvastatin] Other (See Comments)   Muscle pain   Percocet [oxycodone-acetaminophen ] Other (See Comments)   hallucinations   Sulfa Antibiotics Hives   Alendronate Nausea Only        Medication List     STOP taking these medications    aspirin  EC 81 MG tablet Replaced by: Aspirin  Low Dose 81 MG chewable tablet       TAKE these medications    acetaminophen  500 MG tablet Commonly known as: TYLENOL  Take 500-1,000 mg by mouth every 6 (six) hours as needed (pain.).   amLODipine  10 MG tablet Commonly known as: NORVASC  Take 1 tablet (10 mg total) by mouth daily. What changed: when to take this   Aspirin  Low Dose 81 MG chewable tablet Generic drug: aspirin  Chew 1 tablet (81 mg total) by mouth 2 (two) times daily for 28 days. Replaces: aspirin  EC 81 MG tablet   azelastine  0.1 % nasal spray Commonly known as: ASTELIN  Place 2 sprays into both nostrils 2 (two) times daily. Use in each nostril as directed   cetirizine 10 MG tablet Commonly known as: ZYRTEC Take 10 mg by mouth at bedtime.   D3-1000 PO Take 1,000 Units by mouth at bedtime.   estradiol 0.1 MG/GM vaginal  cream Commonly known as: ESTRACE Place 2 g vaginally 2 (two) times a week.   famotidine  20 MG tablet Commonly known as: PEPCID  Take 20 mg by mouth daily as needed for heartburn or indigestion.   hydrochlorothiazide  25 MG tablet Commonly known as: HYDRODIURIL  Take 25 mg by mouth in the morning.   HYDROmorphone  2 MG tablet Commonly known as: DILAUDID  Take 1 tablet (2 mg total) by mouth every 4 (four) hours as needed for severe pain (pain score 7-10).   methocarbamol  500 MG tablet Commonly known as: ROBAXIN  Take 1 tablet (500  mg total) by mouth every 6 (six) hours as needed for muscle spasms.   NASACORT  ALLERGY 24HR NA Place 2 sprays into the nose in the morning and at bedtime.   ondansetron  4 MG tablet Commonly known as: ZOFRAN  Take 1 tablet (4 mg total) by mouth every 6 (six) hours as needed for nausea. What changed:  when to take this reasons to take this   pantoprazole  40 MG tablet Commonly known as: PROTONIX  40 mg 2 (two) times daily.   polyethylene glycol powder 17 GM/SCOOP powder Commonly known as: GLYCOLAX /MIRALAX  Dissolve 1 capful (17g) in 4-8 ounces of liquid and take by mouth twice daily.   PROBIOTIC DAILY PO Take 1 capsule by mouth in the morning.   ramipril  10 MG capsule Commonly known as: ALTACE  Take 20 mg by mouth in the morning.   senna 8.6 MG Tabs tablet Commonly known as: SENOKOT Take 2 tablets (17.2 mg total) by mouth at bedtime for 14 days.   vitamin C 1000 MG tablet Take 1,000 mg by mouth at bedtime.               Discharge Care Instructions  (From admission, onward)           Start     Ordered   01/06/24 0000  Change dressing       Comments: Maintain surgical dressing until follow up in the clinic. If the edges start to pull up, may reinforce with tape. If the dressing is no longer working, may remove and cover with gauze and tape, but must keep the area dry and clean.  Call with any questions or concerns.   01/06/24 0758            Diagnostic Studies: No results found.  Disposition: Discharge disposition: 01-Home or Self Care       Discharge Instructions     Call MD / Call 911   Complete by: As directed    If you experience chest pain or shortness of breath, CALL 911 and be transported to the hospital emergency room.  If you develope a fever above 101 F, pus (white drainage) or increased drainage or redness at the wound, or calf pain, call your surgeon's office.   Change dressing   Complete by: As directed    Maintain surgical dressing  until follow up in the clinic. If the edges start to pull up, may reinforce with tape. If the dressing is no longer working, may remove and cover with gauze and tape, but must keep the area dry and clean.  Call with any questions or concerns.   Constipation Prevention   Complete by: As directed    Drink plenty of fluids.  Prune juice may be helpful.  You may use a stool softener, such as Colace (over the counter) 100 mg twice a day.  Use MiraLax  (over the counter) for constipation as needed.   Diet - low  sodium heart healthy   Complete by: As directed    Increase activity slowly as tolerated   Complete by: As directed    Weight bearing as tolerated with assist device (walker, cane, etc) as directed, use it as long as suggested by your surgeon or therapist, typically at least 4-6 weeks.   Post-operative opioid taper instructions:   Complete by: As directed    POST-OPERATIVE OPIOID TAPER INSTRUCTIONS: It is important to wean off of your opioid medication as soon as possible. If you do not need pain medication after your surgery it is ok to stop day one. Opioids include: Codeine, Hydrocodone (Norco, Vicodin), Oxycodone(Percocet, oxycontin) and hydromorphone  amongst others.  Long term and even short term use of opiods can cause: Increased pain response Dependence Constipation Depression Respiratory depression And more.  Withdrawal symptoms can include Flu like symptoms Nausea, vomiting And more Techniques to manage these symptoms Hydrate well Eat regular healthy meals Stay active Use relaxation techniques(deep breathing, meditating, yoga) Do Not substitute Alcohol to help with tapering If you have been on opioids for less than two weeks and do not have pain than it is ok to stop all together.  Plan to wean off of opioids This plan should start within one week post op of your joint replacement. Maintain the same interval or time between taking each dose and first decrease the dose.  Cut  the total daily intake of opioids by one tablet each day Next start to increase the time between doses. The last dose that should be eliminated is the evening dose.      TED hose   Complete by: As directed    Use stockings (TED hose) for 2 weeks on both leg(s).  You may remove them at night for sleeping.        Follow-up Information     Ernie Cough, MD. Schedule an appointment as soon as possible for a visit in 2 week(s).   Specialty: Orthopedic Surgery Contact information: [address] [place] [address] [place] [address] [phone]                  Signed: Rosina JONELLE Calin 01/11/2024, 4:12 PM

## 2024-01-20 ENCOUNTER — Other Ambulatory Visit (HOSPITAL_COMMUNITY): Payer: Self-pay

## 2024-01-20 MED ORDER — HYDROMORPHONE HCL 2 MG PO TABS
ORAL_TABLET | ORAL | 0 refills | Status: AC
  Filled 2024-01-20: qty 42, 7d supply, fill #0

## 2024-01-22 ENCOUNTER — Other Ambulatory Visit (HOSPITAL_COMMUNITY): Payer: Self-pay
